# Patient Record
Sex: Female | Born: 1995 | Race: Asian | Hispanic: No
Health system: Midwestern US, Community
[De-identification: ages and names within clinical notes are randomized; demographics above are authoritative.]

## PROBLEM LIST (undated history)

## (undated) DIAGNOSIS — Z23 Encounter for immunization: Secondary | ICD-10-CM

## (undated) DIAGNOSIS — Z20820 Contact with and (suspected) exposure to varicella: Secondary | ICD-10-CM

---

## 2019-12-26 DIAGNOSIS — Z227 Latent tuberculosis: Secondary | ICD-10-CM

## 2019-12-26 HISTORY — DX: Latent tuberculosis: Z22.7

## 2020-01-08 ENCOUNTER — Encounter

## 2020-01-08 ENCOUNTER — Inpatient Hospital Stay: Admit: 2020-01-08 | Discharge: 2020-01-08 | Payer: PRIVATE HEALTH INSURANCE

## 2020-01-08 DIAGNOSIS — Z20820 Contact with and (suspected) exposure to varicella: Secondary | ICD-10-CM

## 2020-01-08 MED ORDER — IMMUNE GLOB,GAMM(IGG)10 %-MALT-IGA OVER 50 MCG/ML INTRAVENOUS SOLUTION
10 % | Freq: Once | INTRAVENOUS | Status: DC
Start: 2020-01-08 — End: 2020-01-09
  Administered 2020-01-08: 19:00:00 via INTRAVENOUS

## 2020-01-08 MED FILL — OCTAGAM 10 % INTRAVENOUS SOLUTION: 10 % | INTRAVENOUS | Qty: 250

## 2020-01-08 NOTE — Progress Notes (Signed)
Received patient to room 302 at this time for IVIG tx.  Patient accompanied by Michela Pitcher interpreter.  Patient oriented to room and plan of care.  Vitals taken- see flowsheet.  IV started- see avatar.  Waiting on MD orders.      1515:  IVIG started at 30 mls per hour.  Patient educated on s/s of reaction via [employer] interpreter.  (AMN healthcare interpretation service not available at this time).  Remaining with patient for first 10 mins of infusion.     1525:  Patient with no s/s of rxn.   Patient instructed to call nurse if any unusual symptoms arise.  Call bell in reach.       1545:  Vitals taken- see flow sheet.  No s/s of rxn.  Infusion titrated up to 60 mls/hr.    1620:  Vitals taken- see flow sheet.  No s/s of rxn.  Infusion titrated up to 120 mls/hr.      1650:  Vitals taken- see flow sheet.  No s/s of rxn.  Infusion titrated up to 240 mls/hr.    1730:  Infusion complete.  Patient tolerated treatment well.  Vitals taken- see flow sheet.  Transportation at [employer] [facility] contacted to pick up patient.      1910:  Soldier here to pick up patient.

## 2020-04-26 NOTE — L&D Delivery Note (Signed)
Operative Delivery Note After 2 hours of laboring down, the pt felt some pressure, baby at +2 station.  Called to attend delivery and at the same time, the FHR dropped to 70-80's.  Dr. Shawnie Pons arrived simultaneously.  FHR remained low, baby crowning and instructed to keep pushing.  However, the baby wasn't moving, so Kiwi recommended to pt and partner. At  a viable female was delivered via .  Presentation: vertex; Position: Right,, Occiput,, Anterior; Station: +3.  Verbal consent: obtained from family and pt.  Kiwi placed on fetal vertex, had one pop-off, and baby delivered over one contraction.  APGAR: 8/9 ; weight   pending.  After -2 minutes, the cord was clamped and cut. 40 units of pitocin diluted in 1000cc LR was infused rapidly IV.  The placenta separated spontaneously and delivered via CCT and maternal pushing effort.  It was inspected and appears to be intact with a 3 VC. There was 700cc of blood (no clots) in the amniotic sac when the placenta was delivered.  Manual sweep of LUS did not reveal any rupture.  Throughout the repair, the uterus would have periods of bogginess/bleeding.  TXA 1gm given IV and cytotec given PR>bladder emptied of 200cc urine, clots removed from LUS/cx.       Cord pH: 7.16  Anesthesia:  epidural Episiotomy:   Lacerations:  2nd degree Suture Repair: 2.0 vicryl Est. Blood Loss (mL):  1100  Mom to postpartum.  Baby to Couplet care / Skin to Skin.  Jean Myers 07/28/2020, 11:21 PM

## 2020-05-21 ENCOUNTER — Ambulatory Visit (INDEPENDENT_AMBULATORY_CARE_PROVIDER_SITE_OTHER): Payer: Medicaid Other | Admitting: Internal Medicine

## 2020-05-21 ENCOUNTER — Encounter: Payer: Self-pay | Admitting: Internal Medicine

## 2020-05-21 ENCOUNTER — Other Ambulatory Visit: Payer: Self-pay

## 2020-05-21 VITALS — BP 100/60 | HR 99 | Resp 12 | Ht 61.5 in | Wt 165.5 lb

## 2020-05-21 DIAGNOSIS — Z3490 Encounter for supervision of normal pregnancy, unspecified, unspecified trimester: Secondary | ICD-10-CM

## 2020-05-21 DIAGNOSIS — Z227 Latent tuberculosis: Secondary | ICD-10-CM | POA: Diagnosis not present

## 2020-05-21 LAB — POCT URINE PREGNANCY: Preg Test, Ur: POSITIVE — AB

## 2020-05-21 MED ORDER — PNV PRENATAL PLUS MULTIVITAMIN 27-1 MG PO TABS: ORAL_TABLET | ORAL | 11 refills | Status: DC

## 2020-05-21 NOTE — Progress Notes (Addendum)
    Subjective:    Patient ID: Jean Myers, female   DOB: 10-23-1995, 25 y.o.   MRN: [id number]   HPI  Here to establish Jean Myers interprets [place] via phone Sponsor I spoke with to get her in quickly not present.  Her husband is here and they have no documents of her previous care.  Was pregnant before fleeing [place].  She does not recall her last period.  [place] ago. Had not had period for 2 months prior to leaving [place].  She did have an ultrasound for dates at the camp in [place] after arriving in [place].  States she was told she was at 7 months gestation--December 15th.  Was to have another ultrasound on January 15th, but had already left for [facility].    No problems with appetite.  G3P2 4.25 yo delivered by NVD  25 yo delivered by C/S for fetal Macrosomia --11-12 lb baby.  Did have prenatal care.  Denies history of GDM.  Did have blood testing at 4 and 7 months gestation.    No outpatient medications have been marked as taking for the 05/21/20 encounter (Office Visit) with Jean Manson, MD.  Taking PNV or folic acid daily she received at camp.  Sounds like folic acid as very small pill. 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  No Known Allergies      Review of Systems    Objective:   BP 100/60 (BP Location: Left Arm, Patient Position: Sitting, Cuff Size: Normal)   Pulse 99   Resp 12   Ht 5' 1.5" (1.562 m)   Wt 165 lb 8 oz (75.1 kg)   BMI 30.76 kg/m   Physical Exam NAD Lungs:  CTA CV:  RRR without murmur or rub.  Radial and DP pulses normal and equal. Abd:  obviously pregnant.  Do not have ability to listen to fetal HB.  Fundal measurement of approximately 31 cm LE:  No edema.  Urine HCG:  Positive  Assessment & Plan   Pregnancy, uncertain gestational age Concerned, however, she is 7 + months and is not established yet with an OB to receive prenatal care,  particularly with her last child 3 years ago delivered by C/S.   Based on speaking with Jean Myers, she likely has Medicaid and therefore, would not qualify for Adopt A Mom. Her sponsor has no information regarding whether application process was started on this. Called into [facility] and spoke with Jean Myers at [phone].   First appt not until Feb 4th, but will see if can work her in sooner. Will speak with woman sponsor when she returns to [facility] and whether Medicaid application is in the works.  2.  HM:  [employer] [id number].  Discussion of CDC recommendation from [facility] for pregnant women and their babies to be at increased risk of bad outcome with COVID infection.  Previously received Linwood Dibbles on 12/31/19 and recommended to have booster with [employer] or [employer].  3.  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

## 2020-05-29 ENCOUNTER — Ambulatory Visit
Admission: RE | Admit: 2020-05-29 | Discharge: 2020-05-29 | Disposition: A | Discharge status: Home / Self Care | Payer: No Typology Code available for payment source | Source: Ambulatory Visit | Attending: Obstetrics and Gynecology | Admitting: Obstetrics and Gynecology

## 2020-05-29 ENCOUNTER — Other Ambulatory Visit: Payer: Self-pay

## 2020-05-29 ENCOUNTER — Other Ambulatory Visit: Payer: Self-pay | Admitting: Obstetrics and Gynecology

## 2020-05-29 DIAGNOSIS — Z111 Encounter for screening for respiratory tuberculosis: Secondary | ICD-10-CM

## 2020-06-07 ENCOUNTER — Inpatient Hospital Stay (HOSPITAL_BASED_OUTPATIENT_CLINIC_OR_DEPARTMENT_OTHER): Payer: Medicaid Other

## 2020-06-07 ENCOUNTER — Inpatient Hospital Stay (HOSPITAL_COMMUNITY)
Admission: AD | Admit: 2020-06-07 | Discharge: 2020-06-07 | Disposition: A | Discharge status: Home / Self Care | Payer: Medicaid Other | Attending: Obstetrics and Gynecology | Admitting: Obstetrics and Gynecology

## 2020-06-07 ENCOUNTER — Encounter (HOSPITAL_COMMUNITY): Payer: Self-pay | Admitting: Obstetrics and Gynecology

## 2020-06-07 ENCOUNTER — Other Ambulatory Visit: Payer: Self-pay

## 2020-06-07 DIAGNOSIS — Z3A33 33 weeks gestation of pregnancy: Secondary | ICD-10-CM | POA: Diagnosis not present

## 2020-06-07 DIAGNOSIS — O0933 Supervision of pregnancy with insufficient antenatal care, third trimester: Secondary | ICD-10-CM | POA: Diagnosis not present

## 2020-06-07 DIAGNOSIS — O26893 Other specified pregnancy related conditions, third trimester: Secondary | ICD-10-CM | POA: Diagnosis not present

## 2020-06-07 DIAGNOSIS — O99891 Other specified diseases and conditions complicating pregnancy: Secondary | ICD-10-CM

## 2020-06-07 DIAGNOSIS — O34219 Maternal care for unspecified type scar from previous cesarean delivery: Secondary | ICD-10-CM

## 2020-06-07 DIAGNOSIS — R1084 Generalized abdominal pain: Secondary | ICD-10-CM | POA: Diagnosis not present

## 2020-06-07 DIAGNOSIS — R109 Unspecified abdominal pain: Secondary | ICD-10-CM | POA: Diagnosis present

## 2020-06-07 DIAGNOSIS — Z3687 Encounter for antenatal screening for uncertain dates: Secondary | ICD-10-CM

## 2020-06-07 DIAGNOSIS — Z363 Encounter for antenatal screening for malformations: Secondary | ICD-10-CM

## 2020-06-07 LAB — CBC
HCT: 30.5 % — ABNORMAL LOW (ref 36.0–46.0)
Hemoglobin: 9.6 g/dL — ABNORMAL LOW (ref 12.0–15.0)
MCH: 25.8 pg — ABNORMAL LOW (ref 26.0–34.0)
MCHC: 31.5 g/dL (ref 30.0–36.0)
MCV: 82 fL (ref 80.0–100.0)
Platelets: 246 10*3/uL (ref 150–400)
RBC: 3.72 MIL/uL — ABNORMAL LOW (ref 3.87–5.11)
RDW: 13.2 % (ref 11.5–15.5)
WBC: 7.6 10*3/uL (ref 4.0–10.5)
nRBC: 0 % (ref 0.0–0.2)

## 2020-06-07 LAB — URINALYSIS, ROUTINE W REFLEX MICROSCOPIC
Bilirubin Urine: NEGATIVE
Glucose, UA: 100 mg/dL — AB
Hgb urine dipstick: NEGATIVE
Ketones, ur: NEGATIVE mg/dL
Nitrite: NEGATIVE
Protein, ur: NEGATIVE mg/dL
Specific Gravity, Urine: 1.02 (ref 1.005–1.030)
pH: 6.5 (ref 5.0–8.0)

## 2020-06-07 LAB — URINALYSIS, MICROSCOPIC (REFLEX)

## 2020-06-07 LAB — COMPREHENSIVE METABOLIC PANEL
ALT: 23 U/L (ref 0–44)
AST: 32 U/L (ref 15–41)
Albumin: 2.6 g/dL — ABNORMAL LOW (ref 3.5–5.0)
Alkaline Phosphatase: 102 U/L (ref 38–126)
Anion gap: 10 (ref 5–15)
BUN: 6 mg/dL (ref 6–20)
CO2: 22 mmol/L (ref 22–32)
Calcium: 8.3 mg/dL — ABNORMAL LOW (ref 8.9–10.3)
Chloride: 103 mmol/L (ref 98–111)
Creatinine, Ser: 0.44 mg/dL (ref 0.44–1.00)
GFR, Estimated: >60 mL/min (ref >60)
Glucose, Bld: 98 mg/dL (ref 70–99)
Potassium: 3.8 mmol/L (ref 3.5–5.1)
Sodium: 135 mmol/L (ref 135–145)
Total Bilirubin: 1 mg/dL (ref 0.3–1.2)
Total Protein: 6.1 g/dL — ABNORMAL LOW (ref 6.5–8.1)

## 2020-06-07 LAB — TSH: TSH: 1.471 u[IU]/mL (ref 0.350–4.500)

## 2020-06-07 LAB — ABO/RH: ABO/RH(D): A POS

## 2020-06-07 LAB — PROTEIN / CREATININE RATIO, URINE
Creatinine, Urine: 123.73 mg/dL
Protein Creatinine Ratio: 0.16 mg/mg{Cre} — ABNORMAL HIGH (ref 0.00–0.15)
Total Protein, Urine: 20 mg/dL

## 2020-06-07 LAB — RAPID HIV SCREEN (HIV 1/2 AB+AG)
HIV 1/2 Antibodies: NONREACTIVE
HIV-1 P24 Antigen - HIV24: NONREACTIVE

## 2020-06-07 LAB — HEMOGLOBIN A1C
Hgb A1c MFr Bld: 5.4 % (ref 4.8–5.6)
Mean Plasma Glucose: 108.28 mg/dL

## 2020-06-07 LAB — HEPATITIS C ANTIBODY: HCV Ab: NONREACTIVE

## 2020-06-07 MED ORDER — POLYSACCHARIDE IRON COMPLEX 150 MG PO CAPS: 150.0000 mg | ORAL_CAPSULE | ORAL | 3 refills | Status: DC

## 2020-06-07 NOTE — ED Notes (Signed)
PA at triage for MSE 

## 2020-06-07 NOTE — ED Notes (Signed)
Report called in [place] in MAU.

## 2020-06-07 NOTE — ED Triage Notes (Signed)
Emergency Medicine Provider OB Triage Evaluation Note  Jean Myers is a 25 y.o. female, G1P0, at Unknown gestation who presents to the emergency department with complaints of abdominal pain in pregnancy. Limited history as patient does not speak [place] and interpreter services are not available.  Additionally, patient cannot read, once again limiting ability to translate.  Per friend who is with patient, she has had a week of abdominal pain.  Unknown gestational age.  Per chart review, patient is thought to be about 7 months pregnant.  She did have ultrasound confirming placement in the past, however was scheduled to have repeat ultrasound and never did.  Review of  Systems  Positive: abd pain Negative: bleeding  Physical Exam  BP (!) 104/57 (BP Location: Left Arm)   Pulse 96   Temp 98.4 F (36.9 C) (Oral)   Resp 14   LMP  (LMP Unknown)   SpO2 100%  General: Awake, no distress  HEENT: Atraumatic  Resp: Normal effort  Cardiac: Normal rate Abd: Nondistended, nontender  MSK: Moves all extremities without difficulty Neuro: Speech clear  Medical Decision Making  Pt evaluated for pregnancy concern and is stable for transfer to MAU. Pt is in agreement with plan for transfer.  4:52 PM Discussed with MAU APP, Sam, who accepts patient in transfer.  Clinical Impression  No diagnosis found.     Alveria Apley, PA-C 06/07/20 1653

## 2020-06-07 NOTE — MAU Provider Note (Signed)
History     CSN: [id number]  Arrival date and time: 06/07/20 1611   Event Date/Time   First Provider Initiated Contact with Patient 06/07/20 1939      Chief Complaint  Patient presents with  . Abdominal Pain   HPI Jean Myers is a 25 y.o. G3P2002 at 7 months of pregnancy who presents to MAU from [facility] for evaluation of abdominal pain. This is a new problem, onset one month ago but intensifying in the past 1-2 weeks. Her pain score is 3/10. Her pain is located beneath her cesarean section incision. She has not taken medication or tried other treatments for this complaint.   Patient is a refugee from [place] and has not had prenatal care this pregnancy. OB history is significant for term SVD followed by cesarean for fetal macrosomia. She desires TOLAC if possible.  Patient's PMH significant for latent Tuberculosis. She is compliant with treatment and is being managed by [facility].  Patient reports being scheduled for a New OB at [facility] but states she presented for her appointment, had labs collected, and then was told she could not be seen there for prenatal care.   OB History    Gravida  3   Para  2   Term  2   Preterm  0   AB  0   Living  2     SAB  0   IAB  0   Ectopic  0   Multiple  0   Live Births  2           Past Medical History:  Diagnosis Date  . 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  obtaining treatment through [facility]--they come to her home every morning for observed treatment.    Past Surgical History:  Procedure Laterality Date  . CESAREAN SECTION      History reviewed. No pertinent family history.  Social History   Tobacco Use  . Smoking status: Never Smoker  . Smokeless tobacco: Never Used  Vaping Use  . Vaping Use: Never used  Substance Use Topics  . Alcohol use: Never  . Drug use: Never    Allergies: No Known Allergies  Medications Prior to Admission  Medication Sig Dispense Refill Last Dose  . Prenatal  Vit-Fe Fumarate-FA (PNV PRENATAL PLUS MULTIVITAMIN) 27-1 MG TABS 1 tab by mouth daily 30 tablet 11     Review of Systems  Gastrointestinal: Positive for abdominal pain.  All other systems reviewed and are negative.  Physical Exam   Blood pressure 102/61, pulse 87, temperature 98.2 F (36.8 C), temperature source Oral, resp. rate 14, SpO2 99 %.  Physical Exam Vitals and nursing note reviewed. Exam conducted with a chaperone present.  Cardiovascular:     Rate and Rhythm: Normal rate.     Heart sounds: Normal heart sounds.  Pulmonary:     Effort: Pulmonary effort is normal.     Breath sounds: Normal breath sounds.  Abdominal:     Tenderness: There is no abdominal tenderness. There is no right CVA tenderness or left CVA tenderness.  Skin:    Capillary Refill: Capillary refill takes less than 2 seconds.     Comments: LTCS scar  Neurological:     Mental Status: She is alert and oriented to person, place, and time.  Psychiatric:        Mood and Affect: Mood normal.        Behavior: Behavior normal.    MAU Course  Procedures  --Patient's sponsor reports [facility] agreed to manage treatment for latent Tb but reported she could not receive prenatal care with [facility]. --Reactive tracing: baseline 140, mod var, + 15 x 15 accels, no decels --Toco: occasional contractions, not felt by patient, in setting of closed cervix  Patient Vitals for the past 24 hrs:  BP Temp Temp src Pulse Resp SpO2  06/07/20 2102 102/61 98.2 F (36.8 C) Oral 87 14 99 %  06/07/20 1728 (!) 108/57 97.8 F (36.6 C) Oral 87 16 98 %  06/07/20 1629 (!) 104/57 98.4 F (36.9 C) Oral 96 14 100 %   Orders Placed This Encounter  Procedures  . [place] MFM OB Comp + 14 Weeks  . CBC  . Comprehensive metabolic panel  . TSH  . Hemoglobin A1c  . Rapid HIV screen (HIV 1/2 Ab+Ag)  . Hepatitis B surface antigen  . Hepatitis C antibody  . Urinalysis, Routine w reflex microscopic  . Protein / creatinine ratio, urine  . Urinalysis,  Microscopic (reflex)  . ABO/Rh  . Discharge patient   Results for orders placed or performed during the hospital encounter of 06/07/20 (from the past 24 hour(s))  CBC     Status: Abnormal   Collection Time: 06/07/20  6:28 PM  Result Value Ref Range   WBC 7.6 4.0 - 10.5 K/uL   RBC 3.72 (L) 3.87 - 5.11 MIL/uL   Hemoglobin 9.6 (L) 12.0 - 15.0 g/dL   HCT 36.1 (L) 44.3 - 15.4 %   MCV 82.0 80.0 - 100.0 fL   MCH 25.8 (L) 26.0 - 34.0 pg   MCHC 31.5 30.0 - 36.0 g/dL   RDW 00.8 67.6 - 19.5 %   Platelets 246 150 - 400 K/uL   nRBC 0.0 0.0 - 0.2 %  Comprehensive metabolic panel     Status: Abnormal   Collection Time: 06/07/20  6:28 PM  Result Value Ref Range   Sodium 135 135 - 145 mmol/L   Potassium 3.8 3.5 - 5.1 mmol/L   Chloride 103 98 - 111 mmol/L   CO2 22 22 - 32 mmol/L   Glucose, Bld 98 70 - 99 mg/dL   BUN 6 6 - 20 mg/dL   Creatinine, Ser 0.93 0.44 - 1.00 mg/dL   Calcium 8.3 (L) 8.9 - 10.3 mg/dL   Total Protein 6.1 (L) 6.5 - 8.1 g/dL   Albumin 2.6 (L) 3.5 - 5.0 g/dL   AST 32 15 - 41 U/L   ALT 23 0 - 44 U/L   Alkaline Phosphatase 102 38 - 126 U/L   Total Bilirubin 1.0 0.3 - 1.2 mg/dL   GFR, Estimated >26 >71 mL/min   Anion gap 10 5 - 15  TSH     Status: None   Collection Time: 06/07/20  6:28 PM  Result Value Ref Range   TSH 1.471 0.350 - 4.500 uIU/mL  Hemoglobin A1c     Status: None   Collection Time: 06/07/20  6:28 PM  Result Value Ref Range   Hgb A1c MFr Bld 5.4 4.8 - 5.6 %   Mean Plasma Glucose 108.28 mg/dL  Rapid HIV screen (HIV 1/2 Ab+Ag)     Status: None   Collection Time: 06/07/20  6:28 PM  Result Value Ref Range   HIV-1 P24 Antigen - HIV24 NON REACTIVE NON REACTIVE   HIV 1/2 Antibodies NON REACTIVE NON REACTIVE   Interpretation (HIV Ag Ab)      A non reactive test result means that HIV 1  or HIV 2 antibodies and HIV 1 p24 antigen were not detected in the specimen.  ABO/Rh     Status: None   Collection Time: 06/07/20  6:28 PM  Result Value Ref Range   ABO/RH(D) A  POS    No rh immune globuloin      NOT A RH IMMUNE GLOBULIN CANDIDATE, PT RH POSITIVE Performed at [facility], [address], [place], [place] [address]   Hepatitis C antibody     Status: None   Collection Time: 06/07/20  6:28 PM  Result Value Ref Range   HCV Ab NON REACTIVE NON REACTIVE  Urinalysis, Routine w reflex microscopic Urine, Clean Catch     Status: Abnormal   Collection Time: 06/07/20  6:50 PM  Result Value Ref Range   Color, Urine AMBER (A) YELLOW   APPearance CLEAR CLEAR   Specific Gravity, Urine 1.020 1.005 - 1.030   pH 6.5 5.0 - 8.0   Glucose, UA 100 (A) NEGATIVE mg/dL   Hgb urine dipstick NEGATIVE NEGATIVE   Bilirubin Urine NEGATIVE NEGATIVE   Ketones, ur NEGATIVE NEGATIVE mg/dL   Protein, ur NEGATIVE NEGATIVE mg/dL   Nitrite NEGATIVE NEGATIVE   Leukocytes,Ua TRACE (A) NEGATIVE  Protein / creatinine ratio, urine     Status: Abnormal   Collection Time: 06/07/20  6:50 PM  Result Value Ref Range   Creatinine, Urine 123.73 mg/dL   Total Protein, Urine 20 mg/dL   Protein Creatinine Ratio 0.16 (H) 0.00 - 0.15 mg/mg[Cre]  Urinalysis, Microscopic (reflex)     Status: Abnormal   Collection Time: 06/07/20  6:50 PM  Result Value Ref Range   RBC / HPF 0-5 0 - 5 RBC/hpf   WBC, UA 0-5 0 - 5 WBC/hpf   Bacteria, UA RARE (A) NONE SEEN   Squamous Epithelial / LPF 0-5 0 - 5   Mucus PRESENT    Meds ordered this encounter  Medications  . iron polysaccharides (NIFEREX) 150 MG capsule    Sig: Take 1 capsule (150 mg total) by mouth every other day.    Dispense:  30 capsule    Refill:  3    Order Specific Question:   Supervising Provider    Answer:   Warden Fillers [[id number]]   Assessment and Plan  --25 y.o. G3P2002 at [redacted]w[redacted]d  --Reactive tracing, closed cervix --No acute findings on MFM scan --Hgb 9.6, PO Fe prescribed --Discharge home in stable condition  F/U: --Message sent to [facility] to schedule appointment ASAP --Patient's sponsor given information related to  low cost medications, expectations for prenatal care timing, signs of worsening acuity which would indicate need for return to MAU  Language barrier: [place] interpreter utilized for all patient interaction  Calvert Cantor, CNM 06/07/2020, 9:17 PM

## 2020-06-07 NOTE — ED Triage Notes (Signed)
Pt fled from [place] and is pregnant.  Unsure LMP.  Sponsor that is here with her and another pregnant pt states she has been trying to get prenatal care for pt and trying to find out how far along she is.  G3. Reports abd pain x 1 week.

## 2020-06-07 NOTE — MAU Note (Signed)
Jean Myers is a 25 y.o. here in MAU reporting: pt reports she is 8 months pregnant and has not been seen by a doctor in 2 months. States she has been having pain at her c/s incision. Pain has been going on for a month but has been worse for the past 1-2 weeks. No bleeding or LOF.  Onset of complaint: ongoing  Pain score: 3/10 (mild)  Vitals:   06/07/20 1629 06/07/20 1728  BP: (!) 104/57 (!) 108/57  Pulse: 96 87  Resp: 14 16  Temp: 98.4 F (36.9 C) 97.8 F (36.6 C)  SpO2: 100% 98%     Lab orders placed from triage: entered by provider

## 2020-06-07 NOTE — Discharge Instructions (Signed)

## 2020-06-08 ENCOUNTER — Encounter: Payer: Self-pay | Admitting: Advanced Practice Midwife

## 2020-06-08 DIAGNOSIS — O34219 Maternal care for unspecified type scar from previous cesarean delivery: Secondary | ICD-10-CM | POA: Insufficient documentation

## 2020-06-08 DIAGNOSIS — Z603 Acculturation difficulty: Secondary | ICD-10-CM | POA: Insufficient documentation

## 2020-06-08 DIAGNOSIS — D509 Iron deficiency anemia, unspecified: Secondary | ICD-10-CM | POA: Insufficient documentation

## 2020-06-08 DIAGNOSIS — Z789 Other specified health status: Secondary | ICD-10-CM | POA: Insufficient documentation

## 2020-06-08 DIAGNOSIS — Z3493 Encounter for supervision of normal pregnancy, unspecified, third trimester: Secondary | ICD-10-CM | POA: Insufficient documentation

## 2020-06-08 DIAGNOSIS — Z0289 Encounter for other administrative examinations: Secondary | ICD-10-CM | POA: Insufficient documentation

## 2020-06-18 ENCOUNTER — Other Ambulatory Visit: Payer: Self-pay

## 2020-06-18 ENCOUNTER — Encounter: Payer: Self-pay | Admitting: *Deleted

## 2020-06-18 ENCOUNTER — Encounter: Payer: Self-pay | Admitting: Advanced Practice Midwife

## 2020-06-18 ENCOUNTER — Ambulatory Visit (INDEPENDENT_AMBULATORY_CARE_PROVIDER_SITE_OTHER): Payer: Medicaid Other | Admitting: Advanced Practice Midwife

## 2020-06-18 VITALS — BP 100/58 | HR 105 | Wt 178.7 lb

## 2020-06-18 DIAGNOSIS — Z98891 History of uterine scar from previous surgery: Secondary | ICD-10-CM

## 2020-06-18 DIAGNOSIS — Z3480 Encounter for supervision of other normal pregnancy, unspecified trimester: Secondary | ICD-10-CM | POA: Insufficient documentation

## 2020-06-18 DIAGNOSIS — Z23 Encounter for immunization: Secondary | ICD-10-CM | POA: Diagnosis not present

## 2020-06-18 DIAGNOSIS — Z3A35 35 weeks gestation of pregnancy: Secondary | ICD-10-CM

## 2020-06-18 NOTE — Progress Notes (Signed)
   PRENATAL VISIT NOTE  Subjective:  Jean Myers is a 25 y.o. G3P2002 at [redacted]w[redacted]d being seen today for initial prenatal visit.  She is currently monitored for the following issues for this low-risk pregnancy and has TB lung, latent; Encounter for health examination of refugee; Hx of cesarean section complicating pregnancy; Vaginal delivery; Language barrier affecting health care; Prenatal care in third trimester; Iron deficiency anemia; and Supervision of other normal pregnancy, antepartum on their problem list.  Patient reports leg pain .  Contractions: Not present. Vag. Bleeding: None.  Movement: Present. Denies leaking of fluid.   The following portions of the patient's history were reviewed and updated as appropriate: allergies, current medications, past family history, past medical history, past social history, past surgical history and problem list.   Objective:   Vitals:   06/18/20 1058  BP: (!) 100/58  Pulse: (!) 105  Weight: 178 lb 11.2 oz (81.1 kg)    Fetal Status: Fetal Heart Rate (bpm): 145   Movement: Present     General:  Alert, oriented and cooperative. Patient is in no acute distress.  Skin: Skin is warm and dry. No rash noted.   Cardiovascular: Normal heart rate noted  Respiratory: Normal respiratory effort, no problems with respiration noted  Abdomen: Soft, gravid, appropriate for gestational age.  Pain/Pressure: Present     Pelvic: Cervical exam deferred        Extremities: Normal range of motion.  Edema: Trace  Mental Status: Normal mood and affect. Normal behavior. Normal judgment and thought content.   Assessment and Plan:  Pregnancy: G3P2002 at [redacted]w[redacted]d 1. Supervision of other normal pregnancy, antepartum - HgB A1c - Hepatitis B Surface AntiGEN - Culture, OB Urine - [place] MFM OB FOLLOW UP; Future - RPR - Rubella screen - Patient had some records with her. Most of these records show testing and treatment for TB. Some prenatal information there. So based on what  she has had in MAU and what I can piece together today these labs above are needed. Plus she will need GC/CT next week with her GBS. Flu vaccine had not been given. Will get Flu vaccine today. She has had tdap and covid vaccines.   2. [redacted] weeks gestation of pregnancy - [place] MFM OB FOLLOW UP; Future - Could not find GTT in patient records, will get GTT at next visit  - GBS/GC/CT at next visit   3. History of C-section - Patient reports that she had a c-section for macrosomia. First baby was vaginal delivery at 4000g and second baby was planned c-section at 52g  - Patient would like to have TOLAC for this pregnancy  - No prior records available and unlikely we will be able to get any records.   Term labor symptoms and general obstetric precautions including but not limited to vaginal bleeding, contractions, leaking of fluid and fetal movement were reviewed in detail with the patient. Please refer to After Visit Summary for other counseling recommendations.   Return in about 1 week (around 06/25/2020).  No future appointments.  Thressa Sheller DNP, CNM  06/18/20  11:31 AM

## 2020-06-18 NOTE — Progress Notes (Signed)
Addendum: Pregnancy Medicaid home form completed. Jadah Bobak,RN

## 2020-06-18 NOTE — Progress Notes (Signed)
Patient states she has leg cramps at night. Patient has normal clear vaginal discharge.

## 2020-06-19 LAB — HEMOGLOBIN A1C
Est. average glucose Bld gHb Est-mCnc: 111 mg/dL
Hgb A1c MFr Bld: 5.5 % (ref 4.8–5.6)

## 2020-06-19 LAB — RUBELLA SCREEN: Rubella Antibodies, IGG: 1.49 index (ref >0.99)

## 2020-06-19 LAB — HEPATITIS B SURFACE ANTIGEN: Hepatitis B Surface Ag: NEGATIVE

## 2020-06-19 LAB — RPR: RPR Ser Ql: NONREACTIVE

## 2020-06-20 LAB — URINE CULTURE, OB REFLEX

## 2020-06-20 LAB — CULTURE, OB URINE

## 2020-06-24 ENCOUNTER — Other Ambulatory Visit: Payer: Self-pay | Admitting: *Deleted

## 2020-06-24 DIAGNOSIS — Z3480 Encounter for supervision of other normal pregnancy, unspecified trimester: Secondary | ICD-10-CM

## 2020-06-26 ENCOUNTER — Other Ambulatory Visit: Payer: No Typology Code available for payment source

## 2020-06-26 ENCOUNTER — Other Ambulatory Visit: Payer: Self-pay

## 2020-06-26 ENCOUNTER — Other Ambulatory Visit (HOSPITAL_COMMUNITY)
Admission: RE | Admit: 2020-06-26 | Discharge: 2020-06-26 | Disposition: A | Discharge status: Home / Self Care | Payer: Medicaid Other | Source: Ambulatory Visit | Attending: Obstetrics and Gynecology | Admitting: Obstetrics and Gynecology

## 2020-06-26 ENCOUNTER — Ambulatory Visit (INDEPENDENT_AMBULATORY_CARE_PROVIDER_SITE_OTHER): Payer: Medicaid Other | Admitting: Obstetrics and Gynecology

## 2020-06-26 ENCOUNTER — Encounter: Payer: Self-pay | Admitting: *Deleted

## 2020-06-26 VITALS — BP 117/69 | HR 101

## 2020-06-26 DIAGNOSIS — Z3480 Encounter for supervision of other normal pregnancy, unspecified trimester: Secondary | ICD-10-CM

## 2020-06-26 DIAGNOSIS — O34219 Maternal care for unspecified type scar from previous cesarean delivery: Secondary | ICD-10-CM

## 2020-06-26 DIAGNOSIS — Z789 Other specified health status: Secondary | ICD-10-CM

## 2020-06-26 DIAGNOSIS — Z3A36 36 weeks gestation of pregnancy: Secondary | ICD-10-CM

## 2020-06-26 NOTE — Progress Notes (Signed)
Jean Myers in person interpreter .

## 2020-06-27 LAB — CBC
Hematocrit: 29.5 % — ABNORMAL LOW (ref 34.0–46.6)
Hemoglobin: 9.3 g/dL — ABNORMAL LOW (ref 11.1–15.9)
MCH: 24 pg — ABNORMAL LOW (ref 26.6–33.0)
MCHC: 31.5 g/dL (ref 31.5–35.7)
MCV: 76 fL — ABNORMAL LOW (ref 79–97)
Platelets: 232 10*3/uL (ref 150–450)
RBC: 3.87 x10E6/uL (ref 3.77–5.28)
RDW: 14.2 % (ref 11.7–15.4)
WBC: 7.4 10*3/uL (ref 3.4–10.8)

## 2020-06-27 LAB — CERVICOVAGINAL ANCILLARY ONLY
Chlamydia: NEGATIVE
Comment: NEGATIVE
Comment: NORMAL
Neisseria Gonorrhea: NEGATIVE

## 2020-06-27 LAB — RPR: RPR Ser Ql: NONREACTIVE

## 2020-06-27 LAB — GLUCOSE TOLERANCE, 2 HOURS W/ 1HR
Glucose, 1 hour: 113 mg/dL (ref 65–179)
Glucose, 2 hour: 111 mg/dL (ref 65–152)
Glucose, Fasting: 73 mg/dL (ref 65–91)

## 2020-06-27 LAB — HIV ANTIBODY (ROUTINE TESTING W REFLEX): HIV Screen 4th Generation wRfx: NONREACTIVE

## 2020-06-28 LAB — STREP GP B NAA: Strep Gp B NAA: NEGATIVE

## 2020-06-28 NOTE — Progress Notes (Signed)
Prenatal Visit Note Date: 06/26/2020 Clinic: [facility]  Subjective:  Jean Myers is a 25 y.o. G3P2002 at [redacted]w[redacted]d being seen today for ongoing prenatal care.  She is currently monitored for the following issues for this high-risk pregnancy and has TB lung, latent; Encounter for health examination of refugee; Hx of cesarean section complicating pregnancy; Language barrier affecting health care; Prenatal care in third trimester; Iron deficiency anemia; and Supervision of other normal pregnancy, antepartum on their problem list.  Patient reports no complaints.   Contractions: Not present. Vag. Bleeding: None.  Movement: Present. Denies leaking of fluid.   The following portions of the patient's history were reviewed and updated as appropriate: allergies, current medications, past family history, past medical history, past social history, past surgical history and problem list. Problem list updated.  Objective:   Vitals:   06/26/20 1013  BP: 117/69  Pulse: (!) 101    Fetal Status: Fetal Heart Rate (bpm): 155 Fundal Height: 36 cm Movement: Present     General:  Alert, oriented and cooperative. Patient is in no acute distress.  Skin: Skin is warm and dry. No rash noted.   Cardiovascular: Normal heart rate noted  Respiratory: Normal respiratory effort, no problems with respiration noted  Abdomen: Soft, gravid, appropriate for gestational age. Pain/Pressure: Present     Pelvic:  Cervical exam deferred        Extremities: Normal range of motion.  Edema: Trace  Mental Status: Normal mood and affect. Normal behavior. Normal judgment and thought content.   Urinalysis:      Assessment and Plan:  Pregnancy: G3P2002 at [redacted]w[redacted]d  1. Hx of cesarean section complicating pregnancy G1: VD. G2: scheduled primary c/s for "big baby". Pt states she was never told she had to have another c/s; that child was 8.5 lbs. D/w her re: tolac r/b/a and she would like to tolac. Consent signed  today - Strep Gp B NAA - Cervicovaginal ancillary only( [place])  2. Supervision of other normal pregnancy, antepartum Routine care. GBS done today. 2h GTT today. Has f/u u/s for anatomy on 3/11  3. Language barrier affecting health care In person interpreter used  Preterm labor symptoms and general obstetric precautions including but not limited to vaginal bleeding, contractions, leaking of fluid and fetal movement were reviewed in detail with the patient. Please refer to After Visit Summary for other counseling recommendations.  RTC 7-10d    [place] Bing, MD

## 2020-06-30 ENCOUNTER — Encounter: Payer: Self-pay | Admitting: Family Medicine

## 2020-07-02 ENCOUNTER — Encounter: Payer: Self-pay | Admitting: Obstetrics and Gynecology

## 2020-07-02 DIAGNOSIS — O99019 Anemia complicating pregnancy, unspecified trimester: Secondary | ICD-10-CM | POA: Insufficient documentation

## 2020-07-03 ENCOUNTER — Encounter: Payer: Self-pay | Admitting: Obstetrics & Gynecology

## 2020-07-03 ENCOUNTER — Ambulatory Visit (INDEPENDENT_AMBULATORY_CARE_PROVIDER_SITE_OTHER): Payer: Medicaid Other | Admitting: Obstetrics & Gynecology

## 2020-07-03 ENCOUNTER — Other Ambulatory Visit: Payer: Self-pay

## 2020-07-03 VITALS — BP 101/66 | HR 103 | Wt 182.7 lb

## 2020-07-03 DIAGNOSIS — Z227 Latent tuberculosis: Secondary | ICD-10-CM

## 2020-07-03 DIAGNOSIS — Z789 Other specified health status: Secondary | ICD-10-CM

## 2020-07-03 DIAGNOSIS — O34219 Maternal care for unspecified type scar from previous cesarean delivery: Secondary | ICD-10-CM

## 2020-07-03 DIAGNOSIS — Z3480 Encounter for supervision of other normal pregnancy, unspecified trimester: Secondary | ICD-10-CM

## 2020-07-03 NOTE — Patient Instructions (Signed)
Vaginal Birth After Cesarean Delivery  Vaginal birth after cesarean delivery (VBAC) is giving birth vaginally after previously delivering a baby through a cesarean section (C-section). A VBAC may be a safe option for you, depending on your health and other factors. It is important to discuss VBAC with your health care provider early in your pregnancy so you can understand the risks, benefits, and options. Having these discussions early will give you time to make your birth plan. Who are the best candidates for VBAC? The best candidates for VBAC are women who:  Have had one or two prior cesarean deliveries, and the incision made during the delivery was horizontal (low transverse).  Do not have a vertical (classical) scar on their uterus.  Have not had a tear in the wall of their uterus (uterine rupture).  Plan to have more pregnancies. A VBAC is also more likely to be successful:  In women who have previously given birth vaginally.  When labor starts by itself (spontaneously) before the due date. What are the benefits of VBAC? The benefits of delivering your baby vaginally instead of by a cesarean delivery include:  A shorter hospital stay.  A faster recovery time.  Less pain.  Avoiding risks associated with major surgery, such as infection and blood clots.  Less blood loss and less need for donated blood (transfusions). What are the risks of VBAC? The main risk of attempting a VBAC is that it may fail, forcing your health care provider to deliver your baby by a C-section. Other risks are rare and include:  Tearing (rupture) of the scar from a past cesarean delivery.  Other risks associated with vaginal deliveries. If a repeat cesarean delivery is needed, the risks include:  Blood loss.  Infection.  Blood clot.  Damage to surrounding organs.  Removal of the uterus (hysterectomy), if it is damaged.  Placenta problems in future pregnancies. What else should I know  about my options? Delivering a baby through a VBAC is similar to having a normal spontaneous vaginal delivery. Therefore, it is safe:  To try with twins.  For your health care provider to try to turn the baby from a breech position (external cephalic version) during labor.  With epidural analgesia for pain relief. Consider where you would like to deliver your baby. VBAC should be attempted in facilities where an emergency cesarean delivery can be performed. VBAC is not recommended for home births. Any changes in your health or your baby's health during your pregnancy may make it necessary to change your initial decision about VBAC. Your health care provider may recommend that you do not attempt a VBAC if:  Your baby's suspected weight is 8.8 lb (4 kg) or more.  You have preeclampsia. This is a condition that causes high blood pressure along with other symptoms, such as swelling and headaches.  You will have VBAC less than 19 months after your cesarean delivery.  You are past your due date.  You need to have labor started (induced) because your cervix is not ready for labor (unfavorable). Where to find more information  American Pregnancy Association: americanpregnancy.org  American Congress of Obstetricians and Gynecologists: acog.org Summary  Vaginal birth after cesarean delivery (VBAC) is giving birth vaginally after previously delivering a baby through a cesarean section (C-section). A VBAC may be a safe option for you, depending on your health and other factors.  Discuss VBAC with your health care provider early in your pregnancy so you can understand the risks, benefits, options, and   have plenty of time to make your birth plan.  The main risk of attempting a VBAC is that it may fail, forcing your health care provider to deliver your baby by a C-section. Other risks are rare. This information is not intended to replace advice given to you by your health care provider. Make sure  you discuss any questions you have with your health care provider. Document Revised: 08/08/2018 Document Reviewed: 07/20/2016 Elsevier Patient Education  2021 Elsevier Inc.  

## 2020-07-03 NOTE — Progress Notes (Signed)
   PRENATAL VISIT NOTE  Subjective:  Jean Myers is a 25 y.o. G3P2002 at [redacted]w[redacted]d being seen today for ongoing prenatal care.  She is currently monitored for the following issues for this high-risk pregnancy and has TB lung, latent; Encounter for health examination of refugee; Hx of cesarean section complicating pregnancy; Language barrier affecting health care; Prenatal care in third trimester; Iron deficiency anemia; Supervision of other normal pregnancy, antepartum; and Anemia in pregnancy on their problem list.  Patient reports no complaints.  Contractions: Not present. Vag. Bleeding: None.  Movement: Present. Denies leaking of fluid.   The following portions of the patient's history were reviewed and updated as appropriate: allergies, current medications, past family history, past medical history, past social history, past surgical history and problem list.   Objective:   Vitals:   07/03/20 1045  BP: 101/66  Pulse: (!) 103  Weight: 182 lb 11.2 oz (82.9 kg)    Fetal Status: Fetal Heart Rate (bpm): 147   Movement: Present     General:  Alert, oriented and cooperative. Patient is in no acute distress.  Skin: Skin is warm and dry. No rash noted.   Cardiovascular: Normal heart rate noted  Respiratory: Normal respiratory effort, no problems with respiration noted  Abdomen: Soft, gravid, appropriate for gestational age.  Pain/Pressure: Present     Pelvic: Cervical exam deferred        Extremities: Normal range of motion.     Mental Status: Normal mood and affect. Normal behavior. Normal judgment and thought content.   Assessment and Plan:  Pregnancy: G3P2002 at [redacted]w[redacted]d 1. Supervision of other normal pregnancy, antepartum Tooth pain probable molar carie - Ambulatory referral to Dentistry  2. TB lung, latent Treated through [facility]  3. Hx of cesarean section complicating pregnancy Plans TOLAC  4. Language barrier affecting health care Dari interpreter  Term labor symptoms and  general obstetric precautions including but not limited to vaginal bleeding, contractions, leaking of fluid and fetal movement were reviewed in detail with the patient. Please refer to After Visit Summary for other counseling recommendations.   Return in about 1 week (around 07/10/2020).  Future Appointments  Date Time Provider Department Center  07/04/2020  1:30 PM [facility]    Scheryl Darter, MD

## 2020-07-03 NOTE — Progress Notes (Signed)
Patient complains of pressure in lower abdomen, where c-section has been done.

## 2020-07-04 ENCOUNTER — Ambulatory Visit: Payer: Medicaid Other | Attending: Advanced Practice Midwife

## 2020-07-04 DIAGNOSIS — Z98891 History of uterine scar from previous surgery: Secondary | ICD-10-CM | POA: Diagnosis present

## 2020-07-04 DIAGNOSIS — O09293 Supervision of pregnancy with other poor reproductive or obstetric history, third trimester: Secondary | ICD-10-CM | POA: Diagnosis not present

## 2020-07-04 DIAGNOSIS — O34219 Maternal care for unspecified type scar from previous cesarean delivery: Secondary | ICD-10-CM | POA: Diagnosis not present

## 2020-07-04 DIAGNOSIS — Z362 Encounter for other antenatal screening follow-up: Secondary | ICD-10-CM

## 2020-07-04 DIAGNOSIS — O0933 Supervision of pregnancy with insufficient antenatal care, third trimester: Secondary | ICD-10-CM | POA: Diagnosis not present

## 2020-07-04 DIAGNOSIS — Z3A37 37 weeks gestation of pregnancy: Secondary | ICD-10-CM

## 2020-07-04 DIAGNOSIS — Z3480 Encounter for supervision of other normal pregnancy, unspecified trimester: Secondary | ICD-10-CM | POA: Insufficient documentation

## 2020-07-04 DIAGNOSIS — Z3A35 35 weeks gestation of pregnancy: Secondary | ICD-10-CM | POA: Diagnosis present

## 2020-07-04 DIAGNOSIS — O26843 Uterine size-date discrepancy, third trimester: Secondary | ICD-10-CM

## 2020-07-08 ENCOUNTER — Telehealth: Payer: Self-pay

## 2020-07-08 NOTE — Telephone Encounter (Signed)
Tried to get [employer] from [employer], none available. Pt has an appointment on 07/10/2020, will set up Iron IV appointment with her on that date.

## 2020-07-08 NOTE — Telephone Encounter (Signed)
-----   Message from Igiugig Bing, MD sent at 07/02/2020  1:23 PM EST ----- Can you call her and let her know that we recommend she get IV iron? I put the orders in the computer. thanks

## 2020-07-10 ENCOUNTER — Ambulatory Visit (INDEPENDENT_AMBULATORY_CARE_PROVIDER_SITE_OTHER): Payer: Medicaid Other | Admitting: Obstetrics & Gynecology

## 2020-07-10 ENCOUNTER — Other Ambulatory Visit: Payer: Self-pay

## 2020-07-10 VITALS — BP 99/62 | HR 91 | Wt 181.0 lb

## 2020-07-10 DIAGNOSIS — Z3480 Encounter for supervision of other normal pregnancy, unspecified trimester: Secondary | ICD-10-CM

## 2020-07-10 DIAGNOSIS — Z603 Acculturation difficulty: Secondary | ICD-10-CM

## 2020-07-10 DIAGNOSIS — Z789 Other specified health status: Secondary | ICD-10-CM

## 2020-07-10 DIAGNOSIS — Z3493 Encounter for supervision of normal pregnancy, unspecified, third trimester: Secondary | ICD-10-CM

## 2020-07-10 DIAGNOSIS — O34219 Maternal care for unspecified type scar from previous cesarean delivery: Secondary | ICD-10-CM

## 2020-07-10 DIAGNOSIS — O24410 Gestational diabetes mellitus in pregnancy, diet controlled: Secondary | ICD-10-CM

## 2020-07-10 NOTE — Progress Notes (Signed)
[employer] # [phone]

## 2020-07-10 NOTE — Patient Instructions (Addendum)
Vaginal Birth After Cesarean Delivery  Vaginal birth after cesarean delivery (VBAC) is giving birth vaginally after previously delivering a baby through a cesarean section (C-section). A VBAC may be a safe option for you, depending on your health and other factors. It is important to discuss VBAC with your health care provider early in your pregnancy so you can understand the risks, benefits, and options. Having these discussions early will give you time to make your birth plan. Who are the best candidates for VBAC? The best candidates for VBAC are women who:  Have had one or two prior cesarean deliveries, and the incision made during the delivery was horizontal (low transverse).  Do not have a vertical (classical) scar on their uterus.  Have not had a tear in the wall of their uterus (uterine rupture).  Plan to have more pregnancies. A VBAC is also more likely to be successful:  In women who have previously given birth vaginally.  When labor starts by itself (spontaneously) before the due date. What are the benefits of VBAC? The benefits of delivering your baby vaginally instead of by a cesarean delivery include:  A shorter hospital stay.  A faster recovery time.  Less pain.  Avoiding risks associated with major surgery, such as infection and blood clots.  Less blood loss and less need for donated blood (transfusions). What are the risks of VBAC? The main risk of attempting a VBAC is that it may fail, forcing your health care provider to deliver your baby by a C-section. Other risks are rare and include:  Tearing (rupture) of the scar from a past cesarean delivery.  Other risks associated with vaginal deliveries. If a repeat cesarean delivery is needed, the risks include:  Blood loss.  Infection.  Blood clot.  Damage to surrounding organs.  Removal of the uterus (hysterectomy), if it is damaged.  Placenta problems in future pregnancies. What else should I know  about my options? Delivering a baby through a VBAC is similar to having a normal spontaneous vaginal delivery. Therefore, it is safe:  To try with twins.  For your health care provider to try to turn the baby from a breech position (external cephalic version) during labor.  With epidural analgesia for pain relief. Consider where you would like to deliver your baby. VBAC should be attempted in facilities where an emergency cesarean delivery can be performed. VBAC is not recommended for home births. Any changes in your health or your baby's health during your pregnancy may make it necessary to change your initial decision about VBAC. Your health care provider may recommend that you do not attempt a VBAC if:  Your baby's suspected weight is 8.8 lb (4 kg) or more.  You have preeclampsia. This is a condition that causes high blood pressure along with other symptoms, such as swelling and headaches.  You will have VBAC less than 19 months after your cesarean delivery.  You are past your due date.  You need to have labor started (induced) because your cervix is not ready for labor (unfavorable). Where to find more information  American Pregnancy Association: americanpregnancy.org  American Congress of Obstetricians and Gynecologists: acog.org Summary  Vaginal birth after cesarean delivery (VBAC) is giving birth vaginally after previously delivering a baby through a cesarean section (C-section). A VBAC may be a safe option for you, depending on your health and other factors.  Discuss VBAC with your health care provider early in your pregnancy so you can understand the risks, benefits, options, and   have plenty of time to make your birth plan.  The main risk of attempting a VBAC is that it may fail, forcing your health care provider to deliver your baby by a C-section. Other risks are rare. This information is not intended to replace advice given to you by your health care provider. Make sure  you discuss any questions you have with your health care provider. Document Revised: 08/08/2018 Document Reviewed: 07/20/2016 Elsevier Patient Education  2021 Elsevier Inc.  

## 2020-07-10 NOTE — Progress Notes (Signed)
   PRENATAL VISIT NOTE  Subjective:  Jean Myers is a 25 y.o. G3P2002 at [redacted]w[redacted]d being seen today for ongoing prenatal care.  She is currently monitored for the following issues for this high-risk pregnancy and has TB lung, latent; Encounter for health examination of refugee; Hx of cesarean section complicating pregnancy; Language barrier affecting health care; Prenatal care in third trimester; Iron deficiency anemia; Supervision of other normal pregnancy, antepartum; and Anemia in pregnancy on their problem list.  Patient reports no complaints.  Contractions: Not present. Vag. Bleeding: None.  Movement: Present. Denies leaking of fluid.   The following portions of the patient's history were reviewed and updated as appropriate: allergies, current medications, past family history, past medical history, past social history, past surgical history and problem list.   Objective:   Vitals:   07/10/20 1520  BP: 99/62  Pulse: 91  Weight: 181 lb (82.1 kg)    Fetal Status: Fetal Heart Rate (bpm): 157   Movement: Present     General:  Alert, oriented and cooperative. Patient is in no acute distress.  Skin: Skin is warm and dry. No rash noted.   Cardiovascular: Normal heart rate noted  Respiratory: Normal respiratory effort, no problems with respiration noted  Abdomen: Soft, gravid, appropriate for gestational age.  Pain/Pressure: Present     Pelvic: Cervical exam deferred        Extremities: Normal range of motion.  Edema: Trace  Mental Status: Normal mood and affect. Normal behavior. Normal judgment and thought content.   Assessment and Plan:  Pregnancy: G3P2002 at [redacted]w[redacted]d 1. Supervision of other normal pregnancy, antepartum Doing well  2. Prenatal care in third trimester   3. Language barrier affecting health care Dari interpreter  4. Hx of cesarean section complicating pregnancy Plans TOLAC  Term labor symptoms and general obstetric precautions including but not limited to vaginal  bleeding, contractions, leaking of fluid and fetal movement were reviewed in detail with the patient. Please refer to After Visit Summary for other counseling recommendations.   Return in about 1 week (around 07/17/2020).  No future appointments.  Scheryl Darter, MD

## 2020-07-16 ENCOUNTER — Telehealth: Payer: Self-pay

## 2020-07-16 ENCOUNTER — Other Ambulatory Visit: Payer: Self-pay

## 2020-07-16 ENCOUNTER — Ambulatory Visit (INDEPENDENT_AMBULATORY_CARE_PROVIDER_SITE_OTHER): Payer: Self-pay | Admitting: Obstetrics & Gynecology

## 2020-07-16 VITALS — BP 112/63 | HR 100 | Wt 181.0 lb

## 2020-07-16 DIAGNOSIS — Z3A39 39 weeks gestation of pregnancy: Secondary | ICD-10-CM

## 2020-07-16 DIAGNOSIS — Z3480 Encounter for supervision of other normal pregnancy, unspecified trimester: Secondary | ICD-10-CM

## 2020-07-16 DIAGNOSIS — O34219 Maternal care for unspecified type scar from previous cesarean delivery: Secondary | ICD-10-CM

## 2020-07-16 MED ORDER — DIPHENHYDRAMINE HCL 25 MG PO TABS: 25.0000 mg | ORAL_TABLET | Freq: Four times a day (QID) | ORAL | 2 refills | Status: DC | PRN

## 2020-07-16 MED ORDER — DOCUSATE SODIUM 100 MG PO CAPS: 100.0000 mg | ORAL_CAPSULE | Freq: Two times a day (BID) | ORAL | 2 refills | Status: DC | PRN

## 2020-07-16 NOTE — Progress Notes (Signed)
   PRENATAL VISIT NOTE  Subjective:  Jean Myers is a 25 y.o. G3P2002 at [redacted]w[redacted]d being seen today for ongoing prenatal care. Patient is Dari-speaking only, interpreter present for this encounter.  She is currently monitored for the following issues for this low-risk pregnancy and has TB lung, latent; Encounter for health examination of refugee; Hx of cesarean section complicating pregnancy; Language barrier affecting health care; Prenatal care in third trimester; Iron deficiency anemia; Supervision of other normal pregnancy, antepartum; and Anemia in pregnancy on their problem list.  Patient reports no complaints.  Contractions: Irritability. Vag. Bleeding: None.  Movement: Present. Denies leaking of fluid.   The following portions of the patient's history were reviewed and updated as appropriate: allergies, current medications, past family history, past medical history, past social history, past surgical history and problem list.   Objective:   Vitals:   07/16/20 1351  BP: 112/63  Pulse: 100  Weight: 181 lb (82.1 kg)    Fetal Status: Fetal Heart Rate (bpm): 140 Fundal Height: 40 cm Movement: Present     General:  Alert, oriented and cooperative. Patient is in no acute distress.  Skin: Skin is warm and dry. No rash noted.   Cardiovascular: Normal heart rate noted  Respiratory: Normal respiratory effort, no problems with respiration noted  Abdomen: Soft, gravid, appropriate for gestational age.  Pain/Pressure: Present     Pelvic: Cervical exam deferred        Extremities: Normal range of motion.  Edema: Trace  Mental Status: Normal mood and affect. Normal behavior. Normal judgment and thought content.   Assessment and Plan:  Pregnancy: G3P2002 at [redacted]w[redacted]d 1. Hx of cesarean section complicating pregnancy 2. [redacted] weeks gestation of pregnancy 3. Supervision of other normal pregnancy, antepartum Desires TOLAC. Postdates testing next week IOL to be scheduled at 41 weeks, orders signed and  held. Colace given for constipation, Benadryl for [place] rash [place]. Please refer to After Visit Summary for other counseling recommendations.   Return in about 1 week (around 07/23/2020) for NST, AFI, OFFICE OB VISIT (MD only).  No future appointments.  Jaynie Collins, MD

## 2020-07-16 NOTE — Telephone Encounter (Signed)
Call received from [facility] Department to report that pt is currently having daily observed treatment for TB. Medication list updated. Report given to Ayanwu, MD. Pt will need treatment daily while inpatient for delivery. Please call Estill Bamberg at [phone] with any concerns.

## 2020-07-22 ENCOUNTER — Encounter: Payer: Self-pay | Admitting: General Practice

## 2020-07-24 ENCOUNTER — Ambulatory Visit: Payer: Self-pay

## 2020-07-24 ENCOUNTER — Other Ambulatory Visit: Payer: Self-pay

## 2020-07-24 ENCOUNTER — Ambulatory Visit (INDEPENDENT_AMBULATORY_CARE_PROVIDER_SITE_OTHER): Payer: Medicaid Other | Admitting: Obstetrics and Gynecology

## 2020-07-24 ENCOUNTER — Ambulatory Visit (INDEPENDENT_AMBULATORY_CARE_PROVIDER_SITE_OTHER): Payer: Medicaid Other | Admitting: *Deleted

## 2020-07-24 VITALS — BP 106/56 | HR 111 | Wt 181.8 lb

## 2020-07-24 DIAGNOSIS — O34219 Maternal care for unspecified type scar from previous cesarean delivery: Secondary | ICD-10-CM

## 2020-07-24 DIAGNOSIS — Z789 Other specified health status: Secondary | ICD-10-CM

## 2020-07-24 DIAGNOSIS — Z3480 Encounter for supervision of other normal pregnancy, unspecified trimester: Secondary | ICD-10-CM

## 2020-07-24 DIAGNOSIS — O480 Post-term pregnancy: Secondary | ICD-10-CM | POA: Diagnosis not present

## 2020-07-24 DIAGNOSIS — Z3A40 40 weeks gestation of pregnancy: Secondary | ICD-10-CM

## 2020-07-24 DIAGNOSIS — Z227 Latent tuberculosis: Secondary | ICD-10-CM | POA: Diagnosis not present

## 2020-07-24 NOTE — Progress Notes (Signed)
   PRENATAL VISIT NOTE  Subjective:  Jean Myers is a 25 y.o. G3P2002 at [redacted]w[redacted]d being seen today for ongoing prenatal care.  She is currently monitored for the following issues for this high-risk pregnancy and has TB lung, latent; Encounter for health examination of refugee; Hx of cesarean section complicating pregnancy; Language barrier affecting health care; Prenatal care in third trimester; Iron deficiency anemia; Supervision of other normal pregnancy, antepartum; and Anemia in pregnancy on their problem list.  Patient reports no complaints.  Contractions: Irregular. Vag. Bleeding: None.  Movement: Present. Denies leaking of fluid.   The following portions of the patient's history were reviewed and updated as appropriate: allergies, current medications, past family history, past medical history, past social history, past surgical history and problem list.   Objective:   Vitals:   07/24/20 1438  BP: (!) 106/56  Pulse: (!) 111  Weight: 181 lb 12.8 oz (82.5 kg)    Fetal Status: Fetal Heart Rate (bpm): NST   Movement: Present     General:  Alert, oriented and cooperative. Patient is in no acute distress.  Skin: Skin is warm and dry. No rash noted.   Cardiovascular: Normal heart rate noted  Respiratory: Normal respiratory effort, no problems with respiration noted  Abdomen: Soft, gravid, appropriate for gestational age.  Pain/Pressure: Present     Pelvic: Cervical exam deferred        Extremities: Normal range of motion.  Edema: Trace  Mental Status: Normal mood and affect. Normal behavior. Normal judgment and thought content.   Assessment and Plan:  Pregnancy: G3P2002 at [redacted]w[redacted]d 1. Supervision of other normal pregnancy, antepartum Routine care  2. Language barrier affecting health care Interpreter used  3. TB lung, latent Continue daily treatment when intpatient  4. Hx of cesarean section complicating pregnancy Desires tolac. Pt not set up for this. She desires to go as long  as possible and is okay with IOL; she is amenable to delivery around 41wks. Will call L&D for 4/-6 IOL.   5. [redacted] weeks gestation of pregnancy bpp 10/10 today, cephalic  Term labor symptoms and general obstetric precautions including but not limited to vaginal bleeding, contractions, leaking of fluid and fetal movement were reviewed in detail with the patient. Please refer to After Visit Summary for other counseling recommendations.   No follow-ups on file.  Future Appointments  Date Time Provider Department Center  07/24/2020  3:35 PM [facility] US1 [facility]    [place] Bing, MD

## 2020-07-24 NOTE — Progress Notes (Signed)
Interpreter present for encounter.  

## 2020-07-25 ENCOUNTER — Encounter: Payer: Self-pay | Admitting: *Deleted

## 2020-07-25 ENCOUNTER — Telehealth (HOSPITAL_COMMUNITY): Payer: Self-pay | Admitting: *Deleted

## 2020-07-25 ENCOUNTER — Encounter (HOSPITAL_COMMUNITY): Payer: Self-pay | Admitting: *Deleted

## 2020-07-25 NOTE — Telephone Encounter (Signed)
[phone] interpreter number  Preadmission screen Per Infection Prevention the patient does not need to be on isolation due to previous treatment but continue treatment

## 2020-07-26 ENCOUNTER — Other Ambulatory Visit (HOSPITAL_COMMUNITY): Payer: Medicaid Other | Attending: Family Medicine

## 2020-07-28 ENCOUNTER — Inpatient Hospital Stay (HOSPITAL_COMMUNITY): Payer: Medicaid Other

## 2020-07-28 ENCOUNTER — Other Ambulatory Visit: Payer: Self-pay

## 2020-07-28 ENCOUNTER — Inpatient Hospital Stay (HOSPITAL_COMMUNITY): Payer: Medicaid Other | Admitting: Anesthesiology

## 2020-07-28 ENCOUNTER — Encounter (HOSPITAL_COMMUNITY): Payer: Self-pay | Admitting: Family Medicine

## 2020-07-28 ENCOUNTER — Inpatient Hospital Stay (HOSPITAL_COMMUNITY)
Admission: AD | Admit: 2020-07-28 | Discharge: 2020-07-30 | DRG: 806 | Disposition: A | Discharge status: Home / Self Care | Payer: Medicaid Other | Attending: Family Medicine | Admitting: Family Medicine

## 2020-07-28 DIAGNOSIS — Z789 Other specified health status: Secondary | ICD-10-CM | POA: Diagnosis present

## 2020-07-28 DIAGNOSIS — Z20822 Contact with and (suspected) exposure to covid-19: Secondary | ICD-10-CM | POA: Diagnosis present

## 2020-07-28 DIAGNOSIS — O480 Post-term pregnancy: Principal | ICD-10-CM | POA: Diagnosis present

## 2020-07-28 DIAGNOSIS — Z3A40 40 weeks gestation of pregnancy: Secondary | ICD-10-CM

## 2020-07-28 DIAGNOSIS — O34211 Maternal care for low transverse scar from previous cesarean delivery: Secondary | ICD-10-CM | POA: Diagnosis not present

## 2020-07-28 DIAGNOSIS — O34219 Maternal care for unspecified type scar from previous cesarean delivery: Secondary | ICD-10-CM | POA: Diagnosis present

## 2020-07-28 DIAGNOSIS — D62 Acute posthemorrhagic anemia: Secondary | ICD-10-CM | POA: Diagnosis not present

## 2020-07-28 DIAGNOSIS — D509 Iron deficiency anemia, unspecified: Secondary | ICD-10-CM | POA: Diagnosis present

## 2020-07-28 DIAGNOSIS — Z8615 Personal history of latent tuberculosis infection: Secondary | ICD-10-CM | POA: Diagnosis not present

## 2020-07-28 DIAGNOSIS — Z3480 Encounter for supervision of other normal pregnancy, unspecified trimester: Secondary | ICD-10-CM

## 2020-07-28 DIAGNOSIS — O99019 Anemia complicating pregnancy, unspecified trimester: Secondary | ICD-10-CM | POA: Diagnosis present

## 2020-07-28 DIAGNOSIS — O9081 Anemia of the puerperium: Secondary | ICD-10-CM | POA: Diagnosis not present

## 2020-07-28 DIAGNOSIS — Z227 Latent tuberculosis: Secondary | ICD-10-CM | POA: Diagnosis present

## 2020-07-28 DIAGNOSIS — O0930 Supervision of pregnancy with insufficient antenatal care, unspecified trimester: Secondary | ICD-10-CM

## 2020-07-28 LAB — CBC
HCT: 30.8 % — ABNORMAL LOW (ref 36.0–46.0)
Hemoglobin: 9.2 g/dL — ABNORMAL LOW (ref 12.0–15.0)
MCH: 22.6 pg — ABNORMAL LOW (ref 26.0–34.0)
MCHC: 29.9 g/dL — ABNORMAL LOW (ref 30.0–36.0)
MCV: 75.7 fL — ABNORMAL LOW (ref 80.0–100.0)
Platelets: 229 10*3/uL (ref 150–400)
RBC: 4.07 MIL/uL (ref 3.87–5.11)
RDW: 16.7 % — ABNORMAL HIGH (ref 11.5–15.5)
WBC: 7.4 10*3/uL (ref 4.0–10.5)
nRBC: 0 % (ref 0.0–0.2)

## 2020-07-28 LAB — SARS CORONAVIRUS 2 (TAT 6-24 HRS): SARS Coronavirus 2: NEGATIVE

## 2020-07-28 LAB — TYPE AND SCREEN
ABO/RH(D): A POS
Antibody Screen: NEGATIVE

## 2020-07-28 MED ORDER — TRANEXAMIC ACID-NACL 1000-0.7 MG/100ML-% IV SOLN
INTRAVENOUS | Status: AC
  Administered 2020-07-28: 1000 mg
  Filled 2020-07-28: qty 100

## 2020-07-28 MED ORDER — BUPIVACAINE HCL (PF) 0.25 % IJ SOLN
INTRAMUSCULAR | Status: DC | PRN
  Administered 2020-07-28: 5 mL via EPIDURAL
  Administered 2020-07-28: 8 mL via EPIDURAL

## 2020-07-28 MED ORDER — MISOPROSTOL 200 MCG PO TABS
800.0000 ug | ORAL_TABLET | Freq: Once | ORAL | Status: AC
  Administered 2020-07-28: 800 ug via RECTAL

## 2020-07-28 MED ORDER — OXYTOCIN-SODIUM CHLORIDE 30-0.9 UT/500ML-% IV SOLN: 2.5000 [IU]/h | INTRAVENOUS | Status: DC

## 2020-07-28 MED ORDER — RIFAMPIN 300 MG PO CAPS
600.0000 mg | ORAL_CAPSULE | Freq: Every day | ORAL | Status: DC
  Administered 2020-07-28: 600 mg via ORAL
  Filled 2020-07-28: qty 2

## 2020-07-28 MED ORDER — SOD CITRATE-CITRIC ACID 500-334 MG/5ML PO SOLN: 30.0000 mL | ORAL | Status: DC | PRN

## 2020-07-28 MED ORDER — PHENYLEPHRINE 40 MCG/ML (10ML) SYRINGE FOR IV PUSH (FOR BLOOD PRESSURE SUPPORT)
80.0000 ug | PREFILLED_SYRINGE | INTRAVENOUS | Status: DC | PRN
  Administered 2020-07-28 (×2): 80 ug via INTRAVENOUS

## 2020-07-28 MED ORDER — TRANEXAMIC ACID-NACL 1000-0.7 MG/100ML-% IV SOLN: 1000.0000 mg | Freq: Once | INTRAVENOUS | Status: DC

## 2020-07-28 MED ORDER — OXYTOCIN-SODIUM CHLORIDE 30-0.9 UT/500ML-% IV SOLN
1.0000 m[IU]/min | INTRAVENOUS | Status: DC
  Administered 2020-07-28: 2 m[IU]/min via INTRAVENOUS
  Administered 2020-07-28: 6 m[IU]/min via INTRAVENOUS
  Filled 2020-07-28: qty 500

## 2020-07-28 MED ORDER — ISONIAZID 300 MG PO TABS
300.0000 mg | ORAL_TABLET | Freq: Every day | ORAL | Status: DC
  Administered 2020-07-28: 300 mg via ORAL
  Filled 2020-07-28: qty 1

## 2020-07-28 MED ORDER — VITAMIN B-6 50 MG PO TABS
50.0000 mg | ORAL_TABLET | Freq: Every day | ORAL | Status: DC
  Administered 2020-07-28: 50 mg via ORAL
  Filled 2020-07-28: qty 1

## 2020-07-28 MED ORDER — MISOPROSTOL 200 MCG PO TABS
ORAL_TABLET | ORAL | Status: AC
  Filled 2020-07-28: qty 4

## 2020-07-28 MED ORDER — ZOLPIDEM TARTRATE 5 MG PO TABS: 5.0000 mg | ORAL_TABLET | Freq: Every evening | ORAL | Status: DC | PRN

## 2020-07-28 MED ORDER — OXYCODONE-ACETAMINOPHEN 5-325 MG PO TABS: 2.0000 | ORAL_TABLET | ORAL | Status: DC | PRN

## 2020-07-28 MED ORDER — EPHEDRINE 5 MG/ML INJ
10.0000 mg | INTRAVENOUS | Status: AC | PRN
  Administered 2020-07-28 (×2): 10 mg via INTRAVENOUS

## 2020-07-28 MED ORDER — LACTATED RINGERS IV SOLN
500.0000 mL | INTRAVENOUS | Status: DC | PRN
  Administered 2020-07-28: 1000 mL via INTRAVENOUS
  Administered 2020-07-28: 500 mL via INTRAVENOUS

## 2020-07-28 MED ORDER — DIPHENHYDRAMINE HCL 50 MG/ML IJ SOLN
12.5000 mg | INTRAMUSCULAR | Status: DC | PRN
Start: 2020-07-28 — End: 2020-07-29

## 2020-07-28 MED ORDER — FLEET ENEMA 7-19 GM/118ML RE ENEM: 1.0000 | ENEMA | Freq: Every day | RECTAL | Status: DC | PRN

## 2020-07-28 MED ORDER — FENTANYL-BUPIVACAINE-NACL 0.5-0.125-0.9 MG/250ML-% EP SOLN
12.0000 mL/h | EPIDURAL | Status: DC | PRN
  Administered 2020-07-28: 12 mL/h via EPIDURAL
  Filled 2020-07-28: qty 250

## 2020-07-28 MED ORDER — MISOPROSTOL 25 MCG QUARTER TABLET: 25.0000 ug | ORAL_TABLET | ORAL | Status: DC | PRN

## 2020-07-28 MED ORDER — OXYCODONE-ACETAMINOPHEN 5-325 MG PO TABS: 1.0000 | ORAL_TABLET | ORAL | Status: DC | PRN

## 2020-07-28 MED ORDER — LACTATED RINGERS IV SOLN
500.0000 mL | Freq: Once | INTRAVENOUS | Status: AC
  Administered 2020-07-28: 500 mL via INTRAVENOUS

## 2020-07-28 MED ORDER — ACETAMINOPHEN 325 MG PO TABS: 650.0000 mg | ORAL_TABLET | ORAL | Status: DC | PRN

## 2020-07-28 MED ORDER — FENTANYL CITRATE (PF) 100 MCG/2ML IJ SOLN
50.0000 ug | INTRAMUSCULAR | Status: DC | PRN
  Administered 2020-07-28: 100 ug via INTRAVENOUS
  Administered 2020-07-28: 50 ug via INTRAVENOUS
  Filled 2020-07-28 (×2): qty 2

## 2020-07-28 MED ORDER — TERBUTALINE SULFATE 1 MG/ML IJ SOLN
0.2500 mg | Freq: Once | INTRAMUSCULAR | Status: DC | PRN
Start: 2020-07-28 — End: 2020-07-29

## 2020-07-28 MED ORDER — OXYTOCIN BOLUS FROM INFUSION
333.0000 mL | Freq: Once | INTRAVENOUS | Status: AC
  Administered 2020-07-28: 333 mL via INTRAVENOUS

## 2020-07-28 MED ORDER — HYDROXYZINE HCL 50 MG PO TABS: 50.0000 mg | ORAL_TABLET | Freq: Four times a day (QID) | ORAL | Status: DC | PRN

## 2020-07-28 MED ORDER — PHENYLEPHRINE 40 MCG/ML (10ML) SYRINGE FOR IV PUSH (FOR BLOOD PRESSURE SUPPORT)
80.0000 ug | PREFILLED_SYRINGE | INTRAVENOUS | Status: DC | PRN
  Filled 2020-07-28: qty 10

## 2020-07-28 MED ORDER — LIDOCAINE HCL (PF) 1 % IJ SOLN: 30.0000 mL | INTRAMUSCULAR | Status: DC | PRN

## 2020-07-28 MED ORDER — ONDANSETRON HCL 4 MG/2ML IJ SOLN
4.0000 mg | Freq: Four times a day (QID) | INTRAMUSCULAR | Status: DC | PRN
Start: 2020-07-28 — End: 2020-07-29
  Filled 2020-07-28: qty 2

## 2020-07-28 MED ORDER — LACTATED RINGERS IV SOLN: INTRAVENOUS | Status: DC

## 2020-07-28 MED ORDER — LIDOCAINE-EPINEPHRINE (PF) 2 %-1:200000 IJ SOLN
INTRAMUSCULAR | Status: DC | PRN
  Administered 2020-07-28: 4 mL via EPIDURAL

## 2020-07-28 MED ORDER — EPHEDRINE 5 MG/ML INJ
10.0000 mg | INTRAVENOUS | Status: DC | PRN
  Filled 2020-07-28: qty 10

## 2020-07-28 NOTE — Anesthesia Preprocedure Evaluation (Signed)
Anesthesia Evaluation  Patient identified by MRN, date of birth, ID band Patient awake    Reviewed: Allergy & Precautions, NPO status , Patient's Chart, lab work & pertinent test results  Airway Mallampati: II  TM Distance: >3 FB Neck ROM: Full    Dental   Pulmonary neg pulmonary ROS,    Pulmonary exam normal        Cardiovascular negative cardio ROS   Rhythm:Regular Rate:Normal     Neuro/Psych negative neurological ROS  negative psych ROS   GI/Hepatic negative GI ROS, Neg liver ROS,   Endo/Other  negative endocrine ROS  Renal/GU negative Renal ROS  negative genitourinary   Musculoskeletal negative musculoskeletal ROS (+)   Abdominal (+)  Abdomen: soft. Bowel sounds: normal.  Peds  Hematology  (+) anemia ,   Anesthesia Other Findings   Reproductive/Obstetrics (+) Pregnancy                             Anesthesia Physical Anesthesia Plan  ASA: II  Anesthesia Plan: Epidural   Post-op Pain Management:    Induction:   PONV Risk Score and Plan: 2 and Treatment may vary due to age or medical condition  Airway Management Planned: Natural Airway  Additional Equipment: None  Intra-op Plan:   Post-operative Plan:   Informed Consent: I have reviewed the patients History and Physical, chart, labs and discussed the procedure including the risks, benefits and alternatives for the proposed anesthesia with the patient or authorized representative who has indicated his/her understanding and acceptance.     Interpreter used for [employer] and Sales promotion account executive given  Plan Discussed with:   Anesthesia Plan Comments: (Lab Results      Component                Value               Date                      WBC                      7.4                 07/28/2020                HGB                      9.2 (L)             07/28/2020                HCT                      30.8 (L)             07/28/2020                MCV                      75.7 (L)            07/28/2020                PLT                      229  07/28/2020          )        Anesthesia Quick Evaluation

## 2020-07-28 NOTE — Progress Notes (Signed)
Labor Progress Note Jean Myers is a 25 y.o. G3P2002 at [redacted]w[redacted]d presented for IOL-PD, TOLAC. S: Doing well, feeling contractions.  O:  BP (!) 104/91   Pulse 77   Temp 97.8 F (36.6 C) (Oral)   Resp 14   Ht 5' 2.99" (1.6 m)   Wt 82.5 kg   LMP  (LMP Unknown)   BMI 32.21 kg/m  EFM: baseline 120bpm/mod variability/+accels/no decels Toco: q2-5 min  CVE: Dilation: 5 Effacement (%): 70 Station: -2 Presentation: Vertex Exam by:: Dr. Germaine Pomfret   A&P: 25 y.o. [id number] [redacted]w[redacted]d presented for IOL-PD, TOLAC. #IOL, TOLAC: Patient has made good cervical change since last exam, currently on pitocin 34mL/hr. AROM performed at this check with light meconium stained fluid. Will titrate pitocin 2x2. #Pain: PRN, discussed epidural with patient, patient unsure, anesthesiologist will report to bedside to discuss  #FWB: cat 1 #GBS negative   #Late prenatal care/refugee: SW postpartum  #Anemia of pregnancy: admit hgb 9.6, IV vs PO iron postpartum pending EBL  #Language barrier: speaks Dari, used [employer] interpreter for entirety of visit  #History of cesarean section: stated scheduled for fetal macrosomia, 3856g. No history of VBAC. History of vaginal delivery, 3629g. Both prior deliveries in [place].   #Latent tuberculosis: diagnosed at refugee [place] in [place], has undergone treatment x6 months. Home med regimen ordered, continue postpartum.    Alric Seton, MD 4:34 PM

## 2020-07-28 NOTE — Discharge Summary (Signed)
Postpartum Discharge Summary  Date of Service updated 07/30/20     Patient Name: Jean Myers DOB: 1995/06/08 MRN: [id number]  Date of admission: 07/28/2020 Delivery date:07/28/2020  Delivering provider: Christin Fudge  Date of discharge: 07/30/2020  Admitting diagnosis: Post term pregnancy over 40 weeks [O48.0] Intrauterine pregnancy: [redacted]w[redacted]d    Secondary diagnosis:  Active Problems:   TB lung, latent   Hx of cesarean section complicating pregnancy   Language barrier affecting health care   Iron deficiency anemia   Supervision of other normal pregnancy, antepartum   Anemia in pregnancy   Post term pregnancy over 40 weeks   Late prenatal care  Additional problems: [place]   Discharge diagnosis: VBAC                                              Post partum procedures:IV iron Augmentation: AROM and Pitocin Complications: [id number] Hospital course: Induction of Labor With Vaginal Delivery   25y.o. yo [id number] admitted to the hospital 07/28/2020 for induction of labor.  Indication for induction: postdates.  Patient had an uncomplicated labor course as follows: Membrane Rupture Time/Date: 3:55 PM ,07/28/2020   Delivery Method:VBAC, Vacuum Assisted  Episiotomy: None  Lacerations:  2nd degree;Perineal  Details of delivery can be found in separate delivery note.  Patient had a routine postpartum course. Patient is discharged home 07/30/20. PPH, Hgb 9.2>7.7, received IV Venofer, will continue po Fe QOD at home, asymptomatic.  Continue meds for latent tb  Newborn Data: Birth date:07/28/2020  Birth time:10:27 PM  Gender:Female  Living status:Living  Apgars:8 ,9  Weight:4355 g   Magnesium Sulfate received: No BMZ received: No Rhophylac:N/A MMR:N/A T-DaP:Given prenatally Flu: Yes Transfusion:No  Physical exam  Vitals:   07/29/20 0503 07/29/20 1050 07/29/20 2138 07/30/20 0541  BP: (!) 103/53 (!) 97/59 (!) 97/55 (!) 97/52  Pulse: 99 90 63 73  Resp:  18 16 15 18   Temp: 99.8 F (37.7 C) 98.2 F (36.8 C) 98.1 F (36.7 C) 97.9 F (36.6 C)  TempSrc: Oral Oral Oral Oral  SpO2: 100% 100% 98% 99%  Weight:      Height:       General: alert, cooperative and no distress Lochia: appropriate Uterine Fundus: firm Incision: N/A DVT Evaluation: No evidence of DVT seen on physical exam. Negative Homan's sign. No cords or calf tenderness. No significant calf/ankle edema. Labs: Lab Results  Component Value Date   WBC 12.4 (H) 07/29/2020   HGB 7.7 (L) 07/29/2020   HCT 25.1 (L) 07/29/2020   MCV 75.1 (L) 07/29/2020   PLT 189 07/29/2020   CMP Latest Ref Rng & Units 06/07/2020  Glucose 70 - 99 mg/dL 98  BUN 6 - 20 mg/dL 6  Creatinine 0.44 - 1.00 mg/dL 0.44  Sodium 135 - 145 mmol/L 135  Potassium 3.5 - 5.1 mmol/L 3.8  Chloride 98 - 111 mmol/L 103  CO2 22 - 32 mmol/L 22  Calcium 8.9 - 10.3 mg/dL 8.3(L)  Total Protein 6.5 - 8.1 g/dL 6.1(L)  Total Bilirubin 0.3 - 1.2 mg/dL 1.0  Alkaline Phos 38 - 126 U/L 102  AST 15 - 41 U/L 32  ALT 0 - 44 U/L 23   Edinburgh Score: No flowsheet data found.   After visit meds:  Allergies as of 07/30/2020   No Known Allergies  Medication List    STOP taking these medications   diphenhydrAMINE 25 MG tablet Commonly known as: BENADRYL   docusate sodium 100 MG capsule Commonly known as: COLACE   PNV Prenatal Plus Multivitamin 27-1 MG Tabs     TAKE these medications   ibuprofen 600 MG tablet Commonly known as: ADVIL Take 1 tablet (600 mg total) by mouth every 6 (six) hours.   iron polysaccharides 150 MG capsule Commonly known as: NIFEREX Take 1 capsule (150 mg total) by mouth every other day.   isoniazid 300 MG tablet Commonly known as: NYDRAZID Take by mouth daily.   pyridOXINE 50 MG tablet Commonly known as: VITAMIN B-6 Take 1 tablet (50 mg total) by mouth daily.   rifampin 300 MG capsule Commonly known as: RIFADIN Take 600 mg by mouth.        Discharge home in stable  condition Infant Feeding: Breast Infant Disposition:home with mother Discharge instruction: per After Visit Summary and Postpartum booklet. Activity: Advance as tolerated. Pelvic rest for 6 weeks.  Diet: routine diet Future Appointments: Future Appointments  Date Time Provider [place]  08/25/2020  2:55 PM Starr Lake, CNM [facility]   Follow up Visit:  [facility] for [facility] at [facility] Follow up.   Specialty: Obstetrics and Gynecology Why: call to schedule postpartum visit for 4-6 weeks Contact information: [place] [id number] [phone]               Please schedule this patient for a In person postpartum visit in 4 weeks with the following provider: Any provider. Additional Postpartum F/U:  Low risk pregnancy complicated by:  Delivery mode:  VBAC, Vacuum Assisted  Anticipated Birth Control:  PP Depo given   07/30/2020 Roma Schanz, CNM

## 2020-07-28 NOTE — Progress Notes (Signed)
Patient Vitals for the past 4 hrs:  BP Temp Temp src Pulse Resp SpO2  07/28/20 2120 -- -- -- -- -- 100 %  07/28/20 2115 -- -- -- -- -- 99 %  07/28/20 2110 -- -- -- -- -- 100 %  07/28/20 2105 -- -- -- -- -- 100 %  07/28/20 2100 108/66 -- -- 100 -- 100 %  07/28/20 2056 106/63 -- -- (!) 105 18 --  07/28/20 2055 -- -- -- -- -- 100 %  07/28/20 2050 -- -- -- -- -- 100 %  07/28/20 2045 -- -- -- -- -- 100 %  07/28/20 2040 -- -- -- -- -- 100 %  07/28/20 1930 (!) 110/55 98 F (36.7 C) Oral 96 18 --  07/28/20 1900 (!) 95/46 -- -- 82 -- --  07/28/20 1841 (!) 103/43 -- -- 64 -- --  07/28/20 1830 (!) 88/45 -- -- 68 -- --  07/28/20 1802 -- -- -- -- -- 97 %  07/28/20 1800 (!) 100/43 -- -- 70 -- --  07/28/20 1758 (!) 90/49 -- -- (!) 58 -- --  07/28/20 1755 (!) 88/47 -- -- (!) 59 -- --  07/28/20 1735 (!) 94/53 -- -- 75 -- 96 %   No c/o pressure or pain.  FHR Cat 1.  Ctx q 3-5 minutes  Pitocin at 10 mu/min.  Will increase Pit to improve labor pattern, labor down in different positions to facilitate rotation (? OP per RN exam).

## 2020-07-28 NOTE — H&P (Signed)
OBSTETRIC ADMISSION HISTORY AND PHYSICAL  Jean Myers is a 25 y.o. female G54P2002 with IUP at [redacted]w[redacted]d by 33wk u/s presenting for IOL-PD, TOLAC. She reports +FMs, No LOF, no VB, no blurry vision, headaches or peripheral edema, and RUQ pain.  She plans on breast feeding. She request depo for birth control. She received her prenatal care at [facility]   Dating: By 33 wk u/s --->  Estimated Date of Delivery: 07/23/20  Sono:    07/04/20@[redacted]w[redacted]d , CWD, normal anatomy, cephalic presentation, posterior fundal placental lie, 3485g, 84% EFW   Prenatal History/Complications:  History of cesarean section x1 (fetal macrosomia) Anemia of pregnancy (admit hgb 9.2) Language barrier (Dari) Late prenatal care (presented to [facility] 7 mos) Refugee   Past Medical History: Past Medical History:  Diagnosis Date  . 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  obtaining treatment through [facility]--they come to her home every morning for observed treatment.    Past Surgical History: Past Surgical History:  Procedure Laterality Date  . CESAREAN SECTION      Obstetrical History: OB History    Gravida  3   Para  2   Term  2   Preterm  0   AB  0   Living  2     SAB  0   IAB  0   Ectopic  0   Multiple  0   Live Births  2           Social History Social History   Socioeconomic History  . Marital status: Married    Spouse name: Khailee Mick   . Number of children: 2  . Years of education: Not on file  . Highest education level: Not on file  Occupational History  . Not on file  Tobacco Use  . Smoking status: Never Smoker  . Smokeless tobacco: Never Used  Vaping Use  . Vaping Use: Never used  Substance and Sexual Activity  . Alcohol use: Never  . Drug use: Never  . Sexual activity: Yes    Birth control/protection: None  Other Topics Concern  . Not on file  Social History Narrative  . Not on file   Social Determinants of Health   Financial Resource Strain: Not  on file  Food Insecurity: Food Insecurity Present  . Worried About Programme researcher, broadcasting/film/video in the Last Year: Sometimes true  . Ran Out of Food in the Last Year: Sometimes true  Transportation Needs: Unmet Transportation Needs  . Lack of Transportation (Medical): Yes  . Lack of Transportation (Non-Medical): Yes  Physical Activity: Not on file  Stress: Not on file  Social Connections: Not on file    Family History: History reviewed. No pertinent family history.  Allergies: No Known Allergies  Medications Prior to Admission  Medication Sig Dispense Refill Last Dose  . iron polysaccharides (NIFEREX) 150 MG capsule Take 1 capsule (150 mg total) by mouth every other day. 30 capsule 3 07/27/2020 at Unknown time  . isoniazid (NYDRAZID) 300 MG tablet Take by mouth daily.   07/27/2020 at Unknown time  . Prenatal Vit-Fe Fumarate-FA (PNV PRENATAL PLUS MULTIVITAMIN) 27-1 MG TABS 1 tab by mouth daily 30 tablet 11 07/27/2020 at Unknown time  . rifampin (RIFADIN) 300 MG capsule Take 600 mg by mouth.   07/27/2020 at Unknown time  . diphenhydrAMINE (BENADRYL) 25 MG tablet Take 1-2 tablets (25-50 mg total) by mouth every 6 (six) hours as needed for itching or allergies. (Patient not  taking: Reported on 07/24/2020) 30 tablet 2   . docusate sodium (COLACE) 100 MG capsule Take 1 capsule (100 mg total) by mouth 2 (two) times daily as needed for mild constipation or moderate constipation. (Patient not taking: Reported on 07/24/2020) 30 capsule 2   . pyridOXINE (VITAMIN B-6) 50 MG tablet Take 50 mg by mouth daily. (Patient not taking: Reported on 07/24/2020)        Review of Systems   All systems reviewed and negative except as stated in HPI  Blood pressure (!) 107/58, pulse 75, temperature 97.9 F (36.6 C), temperature source Oral, resp. rate 14, height 5' 2.99" (1.6 m), weight 82.5 kg. General appearance: alert, cooperative and no distress Lungs: normal respiratory effort Heart: regular rate and rhythm Abdomen:  soft, non-tender; gravid Pelvic: as noted below Extremities: Homans sign is negative, no sign of DVT Presentation: cephalic on cervical exam and previously RN bedside u/s Fetal monitoringBaseline: 130 bpm, Variability: Good {> 6 bpm), Accelerations: Reactive and Decelerations: Absent Uterine activityNone Dilation: 2 Effacement (%): 50 Station: -2 Exam by:: Dr. Germaine Pomfret   Prenatal labs: ABO, Rh: --/--/A POS (04/04 1044) Antibody: NEG (04/04 1044) Rubella: 1.49 (02/23 1147) RPR: Non Reactive (03/03 0953)  HBsAg: Negative (02/23 1147)  HIV: Non Reactive (03/03 0953)  GBS: Negative/-- (03/03 1151)  2 hr Glucola passed Genetic screening not done (late to care) Anatomy US normal  Prenatal Transfer Tool  Maternal Diabetes: No Genetic Screening: Declined (late to care) Maternal Ultrasounds/Referrals: Normal Fetal Ultrasounds or other Referrals:  None Maternal Substance Abuse:  No Significant Maternal Medications:  None Significant Maternal Lab Results: Group B Strep negative  Results for orders placed or performed during the hospital encounter of 07/28/20 (from the past 24 hour(s))  CBC   Collection Time: 07/28/20 10:06 AM  Result Value Ref Range   WBC 7.4 4.0 - 10.5 K/uL   RBC 4.07 3.87 - 5.11 MIL/uL   Hemoglobin 9.2 (L) 12.0 - 15.0 g/dL   HCT 25.9 (L) 56.3 - 87.5 %   MCV 75.7 (L) 80.0 - 100.0 fL   MCH 22.6 (L) 26.0 - 34.0 pg   MCHC 29.9 (L) 30.0 - 36.0 g/dL   RDW 64.3 (H) 32.9 - 51.8 %   Platelets 229 150 - 400 K/uL   nRBC 0.0 0.0 - 0.2 %  Type and screen   Collection Time: 07/28/20 10:44 AM  Result Value Ref Range   ABO/RH(D) A POS    Antibody Screen NEG    Sample Expiration      07/31/2020,2359 Performed at [facility], [address], [place], [place] [address]     Patient Active Problem List   Diagnosis Date Noted  . Post term pregnancy over 40 weeks 07/28/2020  . Anemia in pregnancy 07/02/2020  . Supervision of other normal pregnancy, antepartum  06/18/2020  . Encounter for health examination of refugee 06/08/2020  . Hx of cesarean section complicating pregnancy 06/08/2020  . Language barrier affecting health care 06/08/2020  . Iron deficiency anemia 06/08/2020  . TB lung, latent 12/2019    Assessment/Plan:  Chalyn Amescua is a 25 y.o. G3P2002 at [redacted]w[redacted]d here for IOL-PD, TOLAC.  #IOL, TOLAC: Discussed IOL process with patient. Will start low dose pitocin 2x2 up to 42mL/hr. Will re-check in 4 hours. Given cervical exam and prior vaginal delivery will defer FB at this time. #Pain: PRN #FWB:  cat 1 #ID: GBS neg #MOF: breast #MOC: depo #Circ: n/a  #Late prenatal care/refugee: SW postpartum  #  Anemia of pregnancy: admit hgb 9.6, IV vs PO iron postpartum pending EBL  #Language barrier: speaks Dari, used [employer] interpreter for entirety of visit  #History of cesarean section: stated scheduled for fetal macrosomia, 3856g. No history of VBAC. History of vaginal delivery, 3629g. Both prior deliveries in [place].   Alric Seton, MD  07/28/2020, 12:02 PM

## 2020-07-28 NOTE — Anesthesia Procedure Notes (Addendum)
Epidural Patient location during procedure: OB Start time: 07/28/2020 4:25 PM End time: 07/28/2020 4:33 PM  Staffing Anesthesiologist: Atilano Median, DO Performed: anesthesiologist   Preanesthetic Checklist Completed: patient identified, IV checked, site marked, risks and benefits discussed, surgical consent, monitors and equipment checked, pre-op evaluation and timeout performed  Epidural Patient position: sitting Prep: ChloraPrep Patient monitoring: heart rate, continuous pulse ox and blood pressure Approach: midline Location: L3-L4 Injection technique: LOR saline  Needle:  Needle type: Tuohy  Needle gauge: 17 G Needle length: 9 cm Needle insertion depth: 8 cm Catheter type: closed end flexible Catheter size: 20 Guage Catheter at skin depth: 12 cm Test dose: negative and 1.5% lidocaine  Assessment Events: blood not aspirated, injection not painful, no injection resistance and no paresthesia  Additional Notes Patient identified. Risks/Benefits/Options discussed with patient including but not limited to bleeding, infection, nerve damage, paralysis, failed block, incomplete pain control, headache, blood pressure changes, nausea, vomiting, reactions to medications, itching and postpartum back pain. Confirmed with bedside nurse the patient's most recent platelet count. Confirmed with patient that they are not currently taking any anticoagulation, have any bleeding history or any family history of bleeding disorders. Patient expressed understanding and wished to proceed. All questions were answered. Sterile technique was used throughout the entire procedure. Please see nursing notes for vital signs. Test dose was given through epidural catheter and negative prior to continuing to dose epidural or start infusion. Warning signs of high block given to the patient including shortness of breath, tingling/numbness in hands, complete motor block, or any concerning symptoms with instructions  to call for help. Patient was given instructions on fall risk and not to get out of bed. All questions and concerns addressed with instructions to call with any issues or inadequate analgesia.    Reason for block:procedure for pain

## 2020-07-28 NOTE — Lactation Note (Signed)
This note was copied from a baby's chart. Lactation Consultation Note Baby < than hr old at time of consult. Mom holding baby STS. D/t mom's position LC placed baby in football hold. Baby held nipple in mouth suckle twice then held nipple in mouth.  Mom shaking bad. FOB interpret for mom. Mom wanted me to take baby because she was really cold and felt sick. LC took baby wrapped in blanket, asked FOB to give her emesis bag. LC called for RN.  LC will f/u on MBU. Mom doesn't feel well at this time.  Patient Name: Jean Myers WLSLH'T Date: 07/28/2020   Age:32 hours  Maternal Data    Feeding    LATCH Score                    Lactation Tools Discussed/Used    Interventions    Discharge    Consult Status      [name] 07/28/2020, 11:32 PM

## 2020-07-29 LAB — CBC
HCT: 25.1 % — ABNORMAL LOW (ref 36.0–46.0)
Hemoglobin: 7.7 g/dL — ABNORMAL LOW (ref 12.0–15.0)
MCH: 23.1 pg — ABNORMAL LOW (ref 26.0–34.0)
MCHC: 30.7 g/dL (ref 30.0–36.0)
MCV: 75.1 fL — ABNORMAL LOW (ref 80.0–100.0)
Platelets: 189 10*3/uL (ref 150–400)
RBC: 3.34 MIL/uL — ABNORMAL LOW (ref 3.87–5.11)
RDW: 16.6 % — ABNORMAL HIGH (ref 11.5–15.5)
WBC: 12.4 10*3/uL — ABNORMAL HIGH (ref 4.0–10.5)
nRBC: 0 % (ref 0.0–0.2)

## 2020-07-29 LAB — RPR: RPR Ser Ql: NONREACTIVE

## 2020-07-29 MED ORDER — DIPHENHYDRAMINE HCL 25 MG PO CAPS: 25.0000 mg | ORAL_CAPSULE | Freq: Four times a day (QID) | ORAL | Status: DC | PRN

## 2020-07-29 MED ORDER — RIFAMPIN 300 MG PO CAPS
600.0000 mg | ORAL_CAPSULE | Freq: Every day | ORAL | Status: DC
  Administered 2020-07-29 – 2020-07-30 (×2): 600 mg via ORAL
  Filled 2020-07-29 (×3): qty 2

## 2020-07-29 MED ORDER — DIBUCAINE (PERIANAL) 1 % EX OINT: 1.0000 "application " | TOPICAL_OINTMENT | CUTANEOUS | Status: DC | PRN

## 2020-07-29 MED ORDER — DOCUSATE SODIUM 100 MG PO CAPS
100.0000 mg | ORAL_CAPSULE | Freq: Two times a day (BID) | ORAL | Status: DC
  Administered 2020-07-29 – 2020-07-30 (×3): 100 mg via ORAL
  Filled 2020-07-29 (×3): qty 1

## 2020-07-29 MED ORDER — FERROUS SULFATE 325 (65 FE) MG PO TABS
325.0000 mg | ORAL_TABLET | ORAL | Status: DC
  Administered 2020-07-29: 325 mg via ORAL
  Filled 2020-07-29: qty 1

## 2020-07-29 MED ORDER — METHYLERGONOVINE MALEATE 0.2 MG/ML IJ SOLN
0.2000 mg | INTRAMUSCULAR | Status: DC | PRN
Start: 2020-07-28 — End: 2020-07-30

## 2020-07-29 MED ORDER — COCONUT OIL OIL: 1.0000 "application " | TOPICAL_OIL | Status: DC | PRN

## 2020-07-29 MED ORDER — FLEET ENEMA 7-19 GM/118ML RE ENEM: 1.0000 | ENEMA | Freq: Every day | RECTAL | Status: DC | PRN

## 2020-07-29 MED ORDER — ONDANSETRON HCL 4 MG PO TABS: 4.0000 mg | ORAL_TABLET | ORAL | Status: DC | PRN

## 2020-07-29 MED ORDER — MEDROXYPROGESTERONE ACETATE 150 MG/ML IM SUSP: 150.0000 mg | INTRAMUSCULAR | Status: DC | PRN

## 2020-07-29 MED ORDER — PRENATAL MULTIVITAMIN CH
1.0000 | ORAL_TABLET | Freq: Every day | ORAL | Status: DC
  Administered 2020-07-29 – 2020-07-30 (×2): 1 via ORAL
  Filled 2020-07-29 (×2): qty 1

## 2020-07-29 MED ORDER — ACETAMINOPHEN 325 MG PO TABS
650.0000 mg | ORAL_TABLET | ORAL | Status: DC | PRN
  Administered 2020-07-29 – 2020-07-30 (×3): 650 mg via ORAL
  Filled 2020-07-29 (×3): qty 2

## 2020-07-29 MED ORDER — WITCH HAZEL-GLYCERIN EX PADS: 1.0000 "application " | MEDICATED_PAD | CUTANEOUS | Status: DC | PRN

## 2020-07-29 MED ORDER — METHYLERGONOVINE MALEATE 0.2 MG PO TABS
0.2000 mg | ORAL_TABLET | ORAL | Status: DC | PRN
Start: 2020-07-28 — End: 2020-07-30

## 2020-07-29 MED ORDER — TETANUS-DIPHTH-ACELL PERTUSSIS 5-2.5-18.5 LF-MCG/0.5 IM SUSY: 0.5000 mL | PREFILLED_SYRINGE | Freq: Once | INTRAMUSCULAR | Status: DC

## 2020-07-29 MED ORDER — ONDANSETRON HCL 4 MG/2ML IJ SOLN: 4.0000 mg | INTRAMUSCULAR | Status: DC | PRN

## 2020-07-29 MED ORDER — SIMETHICONE 80 MG PO CHEW: 80.0000 mg | CHEWABLE_TABLET | ORAL | Status: DC | PRN

## 2020-07-29 MED ORDER — SODIUM CHLORIDE 0.9 % IV SOLN
500.0000 mg | Freq: Once | INTRAVENOUS | Status: AC
  Administered 2020-07-29: 500 mg via INTRAVENOUS
  Filled 2020-07-29: qty 25

## 2020-07-29 MED ORDER — SODIUM CHLORIDE 0.9 % IV SOLN: 500.0000 mg | INTRAVENOUS | Status: DC

## 2020-07-29 MED ORDER — BISACODYL 10 MG RE SUPP: 10.0000 mg | Freq: Every day | RECTAL | Status: DC | PRN

## 2020-07-29 MED ORDER — IBUPROFEN 600 MG PO TABS
600.0000 mg | ORAL_TABLET | Freq: Four times a day (QID) | ORAL | Status: DC
  Administered 2020-07-29 – 2020-07-30 (×6): 600 mg via ORAL
  Filled 2020-07-29 (×6): qty 1

## 2020-07-29 MED ORDER — BENZOCAINE-MENTHOL 20-0.5 % EX AERO
1.0000 "application " | INHALATION_SPRAY | CUTANEOUS | Status: DC | PRN
  Filled 2020-07-29: qty 56

## 2020-07-29 MED ORDER — VITAMIN B-6 50 MG PO TABS
50.0000 mg | ORAL_TABLET | Freq: Every day | ORAL | Status: DC
  Administered 2020-07-29 – 2020-07-30 (×2): 50 mg via ORAL
  Filled 2020-07-29 (×3): qty 1

## 2020-07-29 MED ORDER — ISONIAZID 300 MG PO TABS
300.0000 mg | ORAL_TABLET | Freq: Every day | ORAL | Status: DC
  Administered 2020-07-29 – 2020-07-30 (×2): 300 mg via ORAL
  Filled 2020-07-29 (×3): qty 1

## 2020-07-29 MED ORDER — MEASLES, MUMPS & RUBELLA VAC IJ SOLR
0.5000 mL | Freq: Once | INTRAMUSCULAR | Status: DC
Start: 2020-07-29 — End: 2020-07-30

## 2020-07-29 NOTE — Clinical Social Work Maternal (Signed)
CLINICAL SOCIAL WORK MATERNAL/CHILD NOTE  Patient Details  Name: Welda Azzarello MRN: [id number] Date of Birth: [birth year]  Date:  07/29/2020  Clinical Social Worker Initiating Note:  Darra Lis, MSW, [place] Date/Time: Initiated:  07/29/20/0930     Child's Name:  Gracy Bruins   Biological Parents:  Mother,Father Sela Hilding)   Need for Interpreter:  Other (Comment Required) Caprice Beaver)   Reason for Referral:  Late or No Prenatal Care    Address:  [address] [place] [address]    Phone number:  [phone] (home)     Additional phone number:   Household Members/Support Persons (HM/SP):   Household Member/Support Person 1,Household Member/Support Person 2,Household Member/Support Person 3   HM/SP Name Relationship DOB or Age  HM/SP -1 Yasin Burges Spouse 30  HM/SP -2 Asra Robb Daughter 4  HM/SP -3 Husna Revuelta Daughter 2  HM/SP -4        HM/SP -5        HM/SP -6        HM/SP -7        HM/SP -8          Natural Supports (not living in the home):      Professional Supports: Other (Comment) Lobbyist)   Employment: Unemployed   Type of Work:     Education:  Other (comment) (8th Grade)   Homebound arranged:    Museum/gallery curator Resources:  Medicaid   Other Resources:  Physicist, medical  (Contacted WIC & had an appointment scheduled)   Cultural/Religious Considerations Which May Impact Care:    Strengths:      Psychotropic Medications:         Pediatrician:       Pediatrician List:   [place]      Pediatrician Fax Number:    Risk Factors/Current Problems:  Scientist, product/process development State:  Linear Thinking ,Alert ,Insightful    Mood/Affect:  Calm ,Interested    CSW Assessment: CSW consulted for late prenatal care at 35w. CSW met with MOB to complete assessment and offer support. CSW utilized Systems developer [phone] and [place] AM[employer]  [place] #[id number]. MOB provided permission to have FOB remain in room for assessment. CSW informed MOB of reason for consult. MOB reported she had a good pregnancy. MOB stated she received prenatal care throughout the pregnancy. FOB shared that MOB had prenatal care on the [facility] in [place], [place]. FOB stated MOB had care since 2 months, but the prenatal records were not sent when they relocated to [facility]. FOB disclosed they also experienced issues considering MOB initially had no insurance. MOB was later able to obtain insurance.The family also recently relocated from [place]. CSW informed MOB of the hospital drug screen policy. MOB aware a CPS report will be made if infant test positive for substances. MOB expressed understanding and denies any substance use.   MOB is currently unemployed and receiving food stamp benefits. Permission was provided for CSW to contact [facility] and have an appointment scheduled for family. MOB denies any mental health history and reported she is currently feeling well. MOB denies any current SI or HI. CSW asked about supports. FOB stated they have minimum supports. FOB shared they were involved with [employer] and were able to get assistance with healthcare and housing. FOB expressed frustration with trying to  get a key for their apartment mailbox and not having a work permit. CSW asked if the agency is able to assist with a work permit. FOB reported the agency is the in the process of closing their case. CSW contacted [employer] to see if they are able to assist FOB with obtaining a work permit. CSW was informed they are unable to provide assistance due to families being assigned by the government once they enter the country. The representative stated the family's current agency should be assisting with that process.  FOB stated they are able to meet the needs of their children and have all necessary items for infant,  including a car seat.   CSW provided education regarding the baby blues period versus PPD. CSW unable to provide the New Mom Checklist due to not having a Dari translation. CSW encouraged MOB to contact a medical professional if PP symptoms discussed are noted at any time. CSW provided review of Sudden Infant Death Syndrome (SIDS) precautions. MOB stated infant has her own bed for sleeping. MOB reported they do not have transportation. CSW provided information on [employer] and had MOB sign the rider liability form. CSW informed family that transportation can be arranged for infant's follow-up appointment.   CSW will continue to follow CDS and make a CPS report if warranted. CSW identifies no further need for intervention and no barriers to discharge at this time.  CSW Plan/Description:  CSW Will Continue to Monitor Umbilical Cord Tissue Drug Screen Results and Make Report if Warranted,Child Protective Service Report ,Hospital Drug Screen Policy Information,Perinatal Mood and Anxiety Disorder (PMADs) Education,Sudden Infant Death Syndrome (SIDS) Education,Other Information/Referral to [employer] Resources,No Further Intervention Required/No Barriers to Discharge    Waylan Boga, [place] 07/29/2020, 10:59 AM

## 2020-07-29 NOTE — Progress Notes (Signed)
Post Partum Day 1 Subjective:  Patient is doing well without complaints. Ambulating without difficulty. Voiding and passing flatus. Tolerating PO. Abdominal pain improved. Vaginal bleeding decreased.  Objective: Blood pressure (!) 97/59, pulse 90, temperature 98.2 F (36.8 C), temperature source Oral, resp. rate 16, height 5' 2.99" (1.6 m), weight 82.5 kg, SpO2 100 %, unknown if currently breastfeeding.  Physical Exam:  General: alert, cooperative and no distress Lochia: appropriate Uterine Fundus: firm Incision: n/a DVT Evaluation: No evidence of DVT seen on physical exam.  Recent Labs    07/28/20 1006 07/29/20 0800  HGB 9.2* 7.7*  HCT 30.8* 25.1*    Assessment/Plan: PPD#1 VAVD, VBAC  -doing well, meeting pp milestones  -VSS  -depo for contraception, ordered  #PPH/Acute blood loss anemia  -hgb 9.2>7.7, received IV Venofer  -asymptomatic  -discharge with PO iron  #Language barrier  -offered stratus interpreter, declined  -husband interpreted for patient  #Refugee/limited [facility]  -SW consulted  Plan for discharge tomorrow given time of delivery.   LOS: 1 day   Alric Seton 07/29/2020, 12:40 PM

## 2020-07-29 NOTE — Progress Notes (Signed)
I have reviewed and concur with students documentation.  

## 2020-07-29 NOTE — Lactation Note (Signed)
This note was copied from a baby's chart. Lactation Consultation Note Baby is 6 hrs old. Used AMN interpreter Byrd Hesselbach [phone] for consult. Dari. Mom is in a lot of pain, cramping and pain in her arm from her IV. Mom has iron being infused. RN notified. Mom had baby on the breast off and on laying in part side lying position. Mom moaning in pain. Mom wasn't in a good position. LC encouraged mom to side up in bed. LC used pillows for props for support. Attempted to latch baby. Baby just held nipple in mouth. Mom stated baby has eaten 30 min. Mom tired wanted baby in bassinet. LC wrapped baby placed in bassinet.  Reviewed newborn behavior, STS. Asked mom if she has WIC mom stated no she didn't know what that was.  Mom really not paying to much attention to [facility] d/t pain. Lactation brochure given. FOB sleeping.  Mom has 5 and 3 yr old that she BF for 2 yr. Each. Encouraged mom to call for assistance in feeding or latching.   Patient Name: Jean Myers [id number]'G Date: 07/29/2020 Reason for consult: Initial assessment;Term Age:26 hours  Maternal Data    Feeding    LATCH Score Latch: Too sleepy or reluctant, no latch achieved, no sucking elicited.  Audible Swallowing: None  Type of Nipple: Everted at rest and after stimulation  Comfort (Breast/Nipple): Soft / non-tender  Hold (Positioning): Assistance needed to correctly position infant at breast and maintain latch.  LATCH Score: 5   Lactation Tools Discussed/Used    Interventions Interventions: Breast feeding basics reviewed;Support pillows;Assisted with latch;Position options;Skin to skin;Adjust position  Discharge [facility] Program: No  Consult Status Consult Status: Follow-up Date: 07/29/20 Follow-up type: In-patient    [name] 07/29/2020, 5:14 AM

## 2020-07-29 NOTE — Progress Notes (Signed)
Bedside RN expressing concern that patient is unable to tolerate IV iron infusion.  IV flushes well, patient tolerates flush, but complains of pain any time Iron is restarted.  Pharmacy suggests Benadryl, Dr. Barb Merino notified of suggestion, requests RN to see if patient willing to try Benadryl & restarting iron infusion.  If not, instructed to call back for PO iron order.  Bedside RN to discuss with patient.  Dr. Barb Merino also notified of patient's inability to void, RN is I&O cathing patient for second time.

## 2020-07-30 MED ORDER — IBUPROFEN 600 MG PO TABS: 600.0000 mg | ORAL_TABLET | Freq: Four times a day (QID) | ORAL | 0 refills | Status: DC

## 2020-07-30 MED ORDER — VITAMIN B-6 50 MG PO TABS: 50.0000 mg | ORAL_TABLET | Freq: Every day | ORAL | 1 refills | Status: DC

## 2020-07-30 NOTE — Anesthesia Postprocedure Evaluation (Signed)
Anesthesia Post Note  Patient: Jean Myers  Procedure(s) Performed: AN AD HOC LABOR EPIDURAL     Patient location during evaluation: Mother Baby Anesthesia Type: Epidural Level of consciousness: awake and alert Pain management: pain level controlled Vital Signs Assessment: post-procedure vital signs reviewed and stable Respiratory status: spontaneous breathing Cardiovascular status: stable Postop Assessment: no headache, adequate PO intake, no backache, patient able to bend at knees, able to ambulate, epidural receding and no apparent nausea or vomiting Anesthetic complications: no   No complications documented.  Last Vitals:  Vitals:   07/29/20 2138 07/30/20 0541  BP: (!) 97/55 (!) 97/52  Pulse: 63 73  Resp: 15 18  Temp: 36.7 C 36.6 C  SpO2: 98% 99%    Last Pain:  Vitals:   07/30/20 0725  TempSrc:   PainSc: 0-No pain   Pain Goal: Patients Stated Pain Goal: 0 (07/29/20 0249)                 Salome Arnt

## 2020-07-30 NOTE — Discharge Instructions (Signed)

## 2020-07-30 NOTE — Lactation Note (Signed)
This note was copied from a baby's chart. Lactation Consultation Note  Patient Name: Jean Myers [id number] Date: 07/30/2020 Reason for consult: Follow-up assessment Age:25 hours   P3 mother whose infant is now 78 hours old.  This is a term baby at 40+5 weeks.  Mother has breast feeding experience with her other two children.  Father requests to be the interpreter for mother's language.  NP in room when I arrived preparing for discharge.  Mother had a few questions which I answered to her satisfaction.  She will continue to feed on cue and at least every three to four hours.  Suggested mother continue to feed STS until mother/baby are feeding well together.  Discussed obtaining a deep latch.   Engorgement prevention/treatment reviewed.  Mother has a manual pump for home use.  Family will have transportation between 1200-1230 and will call when their ride arrives.    Maternal Data    Feeding    LATCH Score Latch: Grasps breast easily, tongue down, lips flanged, rhythmical sucking.  Audible Swallowing: A few with stimulation  Type of Nipple: Everted at rest and after stimulation  Comfort (Breast/Nipple): Soft / non-tender  Hold (Positioning): No assistance needed to correctly position infant at breast.  LATCH Score: 9   Lactation Tools Discussed/Used    Interventions Interventions: Education  Discharge Discharge Education: Engorgement and breast care Pump: Manual  Consult Status Consult Status: Complete Date: 07/30/20 Follow-up type: Call as needed    Neeka Urista R Rafaella Kole 07/30/2020, 11:41 AM

## 2020-07-30 NOTE — Lactation Note (Signed)
This note was copied from a baby's chart. Lactation Consultation Note Mom pointed to bassinet for LC to put sleeping baby in bassinet. LC did so. FOB sleeping soundly. Asked mom if she is OK mom said yes. With FOB sleeping now isn't the time to go over d/c teaching. Experienced BF mom doing well.  Patient Name: Jean Myers [id number] Date: 07/30/2020   Age:25 hours  Maternal Data    Feeding    LATCH Score                    Lactation Tools Discussed/Used    Interventions    Discharge    Consult Status      [name] 07/30/2020, 5:59 AM

## 2020-07-31 LAB — SURGICAL PATHOLOGY

## 2020-08-07 ENCOUNTER — Telehealth: Payer: Self-pay | Admitting: Lactation Services

## 2020-08-07 NOTE — Telephone Encounter (Signed)
Theodoro Clock RN called and LM that she would like a call back in regards to patients most recent labs. She will be going to patients house today and does not want to repeat labs that has been done previously.   Reviewed last CBC was obtained on 4/5. Last CBC faxed to Darl Pikes, [facility].

## 2020-08-12 ENCOUNTER — Telehealth: Payer: Self-pay | Admitting: Lactation Services

## 2020-08-12 NOTE — Telephone Encounter (Signed)
Received call from [facility] nurse Jacquelin Hawking.   She reports she has been out to see patient. Patient is c/o headaches, blurred vision and dizziness upon standing. Her BP today is 96/74. She is staying hydrated and taking her Fe.   Patient is also noting burning with urination and low back pain.   Scheduled nurse visit for Thursday at 10:20 am.   Called patient with assistance of [employer], #[id number].   Patient reports headache when she wakes up or stands up. She is having blurred vision or dizziness with standing. She has not tried Tylenol. She is drinking water and feels the headache improves off and on during the day.   Patient reports she is having burning with urination. This is happening every time she is going to the bathroom.   Advised patient that we would like to bring her into the office to check her BP and urine. Patient given nurse visit appointment time of 4/21 at 10:20 am.   Advised patient to keep eating and drinking as she can and to stand up very slowly.   Advised patient I feeling worse to call us back. Advised if starts feeling worse she can go to [facility] for evaluation. Patient voiced understanding.

## 2020-08-14 ENCOUNTER — Ambulatory Visit: Payer: No Typology Code available for payment source

## 2020-08-25 ENCOUNTER — Ambulatory Visit: Payer: No Typology Code available for payment source | Admitting: Obstetrics and Gynecology

## 2020-09-01 ENCOUNTER — Other Ambulatory Visit: Payer: Self-pay

## 2020-09-01 ENCOUNTER — Ambulatory Visit (INDEPENDENT_AMBULATORY_CARE_PROVIDER_SITE_OTHER): Payer: Medicaid Other | Admitting: Nurse Practitioner

## 2020-09-01 ENCOUNTER — Other Ambulatory Visit (HOSPITAL_COMMUNITY)
Admission: RE | Admit: 2020-09-01 | Discharge: 2020-09-01 | Disposition: A | Discharge status: Home / Self Care | Payer: Medicaid Other | Source: Ambulatory Visit | Attending: Student | Admitting: Student

## 2020-09-01 ENCOUNTER — Encounter: Payer: Self-pay | Admitting: Obstetrics and Gynecology

## 2020-09-01 VITALS — BP 96/56 | HR 65 | Wt 162.6 lb

## 2020-09-01 DIAGNOSIS — Z5941 Food insecurity: Secondary | ICD-10-CM

## 2020-09-01 LAB — POCT URINALYSIS DIP (DEVICE)
Bilirubin Urine: NEGATIVE
Glucose, UA: NEGATIVE mg/dL
Hgb urine dipstick: NEGATIVE
Ketones, ur: NEGATIVE mg/dL
Nitrite: NEGATIVE
Protein, ur: NEGATIVE mg/dL
Specific Gravity, Urine: 1.02 (ref 1.005–1.030)
Urobilinogen, UA: 0.2 mg/dL (ref 0.0–1.0)
pH: 5 (ref 5.0–8.0)

## 2020-09-01 NOTE — Progress Notes (Signed)
Post Partum Visit Note  Jean Myers is a 25 y.o. [id number] female who presents for a postpartum visit. She is 5 weeks postpartum following a vacuum-assisted vaginal delivery.  I have fully reviewed the prenatal and intrapartum course. The delivery was at 40.5 gestational weeks. She was admitted for induction of labor due to postdates.  She had a VBAC and vacuum assisted birth with a 2nd degree laceration.  Anesthesia: epidural. Postpartum course has been good - has some burning with urination. Baby is doing well. Baby is feeding by breast. Bleeding staining only. Bowel function is normal. Bladder function is normal. Patient is not sexually active. States she is having some back pain at the site of the epidural.  Contraception method is Depo-Provera injections. Postpartum depression screening: negative. Patient has had an in person and a video interpreter for her visit today.  The pregnancy intention screening data noted above was reviewed. Potential methods of contraception were discussed. The patient elected to proceed with Hormonal Injection.    Edinburgh Postnatal Depression Scale - 09/01/20 0903      Edinburgh Postnatal Depression Scale:  In the Past 7 Days   I have been able to laugh and see the funny side of things. 0    I have looked forward with enjoyment to things. 0    I have blamed myself unnecessarily when things went wrong. 0    I have been anxious or worried for no good reason. 0    I have felt scared or panicky for no good reason. 0    Things have been getting on top of me. 0    I have been so unhappy that I have had difficulty sleeping. 0    I have felt sad or miserable. 0    I have been so unhappy that I have been crying. 0    The thought of harming myself has occurred to me. 0    Edinburgh Postnatal Depression Scale Total 0           Health Maintenance Due  Topic Date Due  . HPV VACCINES (1 - 2-dose series) Never done  . PAP-Cervical Cytology Screening  Never done   . PAP SMEAR-Modifier  Never done    The following portions of the patient's history were reviewed and updated as appropriate: allergies, current medications, past family history, past medical history, past social history, past surgical history and problem list.  Review of Systems Pertinent items noted in HPI and remainder of comprehensive ROS otherwise negative.  Objective:  BP (!) 96/56   Pulse 65   Wt 162 lb 9.6 oz (73.8 kg)   LMP  (LMP Unknown)   BMI 28.81 kg/m    General:  alert, cooperative and no distress   Breasts:  not indicated  Lungs: clear to auscultation bilaterally  Heart:  regular rate and rhythm, S1, S2 normal, no murmur, click, rub or gallop  Abdomen: soft, nontender   Wound  NA  GU exam:  Normal and 2 stitches still present - have not yet dissolved.  No infection.  No granulomatous tissue seen.       Assessment:     Normal postpartum exam.   Plan:   Essential components of care per ACOG recommendations:  1.  Mood and well being: Patient with negative depression screening today. Reviewed local resources for support.  - Patient tobacco use? No.   - hx of drug use? No.    2. Infant care and feeding:  -Patient  currently breastmilk feeding?  -Social determinants of health (SDOH) reviewed in EPIC.  Food insecurity - to visit food market today in clinic.  3. Sexuality, contraception and birth spacing - Patient does not want a pregnancy in the next year.   - Reviewed forms of contraception in tiered fashion. Patient desired Depo-Provera today.   - Discussed birth spacing of 18 months  4. Sleep and fatigue -Encouraged family/partner/community support of 4 hrs of uninterrupted sleep to help with mood and fatigue  5. Physical Recovery  - Discussed patients delivery and complications. She describes her labor as good. - Patient had a Induction and Vaginal birth (VBAC) with vacuum assistance at birth. Patient had a 2nd degree laceration. Perineal healing  reviewed. Patient expressed understanding.  Advised to wait at least 2 more weeks and when not having urinary burning (due to stitches not yet dissolved) will be able to resume sexual activity - Patient has urinary incontinence? No. - Patient is not safe to resume physical and sexual activity  6.  Health Maintenance - HM due items addressed No - language barrier - Pap smear done at today's visit.  -Breast Cancer screening indicated? No.   7. Chronic Disease/Pregnancy Condition follow up: None Continue TB treatment - observed therapy done by the health department. - PCP follow up  Currie Paris, NP [facility] [employer], [facility]

## 2020-09-01 NOTE — Addendum Note (Signed)
Addended by: Guy Begin on: 09/01/2020 01:52 PM   Modules accepted: Orders

## 2020-09-03 LAB — URINE CULTURE: Organism ID, Bacteria: NO GROWTH

## 2020-09-03 LAB — CYTOLOGY - PAP: Diagnosis: NEGATIVE

## 2020-09-25 ENCOUNTER — Other Ambulatory Visit: Payer: Self-pay | Admitting: Obstetrics and Gynecology

## 2020-09-25 ENCOUNTER — Ambulatory Visit
Admission: RE | Admit: 2020-09-25 | Discharge: 2020-09-25 | Disposition: A | Discharge status: Home / Self Care | Payer: No Typology Code available for payment source | Source: Ambulatory Visit | Attending: Obstetrics and Gynecology | Admitting: Obstetrics and Gynecology

## 2020-09-25 DIAGNOSIS — A159 Respiratory tuberculosis unspecified: Secondary | ICD-10-CM

## 2020-11-20 ENCOUNTER — Ambulatory Visit (INDEPENDENT_AMBULATORY_CARE_PROVIDER_SITE_OTHER): Payer: Medicaid Other | Admitting: General Practice

## 2020-11-20 ENCOUNTER — Other Ambulatory Visit: Payer: Self-pay

## 2020-11-20 DIAGNOSIS — Z3009 Encounter for other general counseling and advice on contraception: Secondary | ICD-10-CM

## 2020-11-20 NOTE — Progress Notes (Signed)
Patient presents to office today for depo provera injection. Patient desires to change birth control to Nexplanon instead. Patient feels initially depo injection may have caused issues with her nerves, fatigue & headaches. Reviewed with patient those are not expected side effects and were likely due to delivery. Discussed Nexplanon could not be inserted today and she will need to return for provider visit. Condoms given today. Also recommended patient find PCP office as she reports continued headaches & back pain. Information for [facility] provided.  Chase Caller RN BSN 11/20/20

## 2020-11-20 NOTE — Progress Notes (Signed)
Patient seen and assessed by nursing staff.  Agree with documentation and plan.  

## 2020-12-25 ENCOUNTER — Ambulatory Visit: Payer: No Typology Code available for payment source | Admitting: Obstetrics & Gynecology

## 2021-10-01 ENCOUNTER — Ambulatory Visit: Payer: No Typology Code available for payment source | Admitting: Student

## 2021-10-21 ENCOUNTER — Other Ambulatory Visit (HOSPITAL_COMMUNITY)
Admission: RE | Admit: 2021-10-21 | Discharge: 2021-10-21 | Disposition: A | Discharge status: Home / Self Care | Payer: Medicaid Other | Source: Ambulatory Visit | Attending: Certified Nurse Midwife | Admitting: Certified Nurse Midwife

## 2021-10-21 ENCOUNTER — Other Ambulatory Visit: Payer: Self-pay

## 2021-10-21 ENCOUNTER — Ambulatory Visit (INDEPENDENT_AMBULATORY_CARE_PROVIDER_SITE_OTHER): Payer: Medicaid Other | Admitting: Certified Nurse Midwife

## 2021-10-21 ENCOUNTER — Encounter: Payer: Self-pay | Admitting: Certified Nurse Midwife

## 2021-10-21 VITALS — BP 115/49 | HR 73 | Wt 162.0 lb

## 2021-10-21 DIAGNOSIS — Z113 Encounter for screening for infections with a predominantly sexual mode of transmission: Secondary | ICD-10-CM | POA: Diagnosis present

## 2021-10-21 DIAGNOSIS — R102 Pelvic and perineal pain: Secondary | ICD-10-CM | POA: Diagnosis not present

## 2021-10-21 DIAGNOSIS — Z3009 Encounter for other general counseling and advice on contraception: Secondary | ICD-10-CM | POA: Diagnosis not present

## 2021-10-21 DIAGNOSIS — Z01419 Encounter for gynecological examination (general) (routine) without abnormal findings: Secondary | ICD-10-CM

## 2021-10-21 DIAGNOSIS — R5383 Other fatigue: Secondary | ICD-10-CM

## 2021-10-22 ENCOUNTER — Telehealth: Payer: Self-pay

## 2021-10-22 LAB — HEPATITIS C ANTIBODY: Hep C Virus Ab: NONREACTIVE

## 2021-10-22 LAB — CERVICOVAGINAL ANCILLARY ONLY
Chlamydia: NEGATIVE
Comment: NEGATIVE
Comment: NEGATIVE
Comment: NORMAL
Neisseria Gonorrhea: NEGATIVE
Trichomonas: NEGATIVE

## 2021-10-22 LAB — THYROID PANEL WITH TSH
Free Thyroxine Index: 1.9 (ref 1.2–4.9)
T3 Uptake Ratio: 25 % (ref 24–39)
T4, Total: 7.7 ug/dL (ref 4.5–12.0)
TSH: 1.68 u[IU]/mL (ref 0.450–4.500)

## 2021-10-22 LAB — CBC
Hematocrit: 38.1 % (ref 34.0–46.6)
Hemoglobin: 13 g/dL (ref 11.1–15.9)
MCH: 28.9 pg (ref 26.6–33.0)
MCHC: 34.1 g/dL (ref 31.5–35.7)
MCV: 85 fL (ref 79–97)
Platelets: 350 10*3/uL (ref 150–450)
RBC: 4.5 x10E6/uL (ref 3.77–5.28)
RDW: 12.6 % (ref 11.7–15.4)
WBC: 7.3 10*3/uL (ref 3.4–10.8)

## 2021-10-22 LAB — HIV ANTIBODY (ROUTINE TESTING W REFLEX): HIV Screen 4th Generation wRfx: NONREACTIVE

## 2021-10-22 LAB — RPR: RPR Ser Ql: NONREACTIVE

## 2021-10-22 LAB — HEPATITIS B SURFACE ANTIGEN: Hepatitis B Surface Ag: NEGATIVE

## 2021-10-22 LAB — HEMOGLOBIN A1C
Est. average glucose Bld gHb Est-mCnc: 111 mg/dL
Hgb A1c MFr Bld: 5.5 % (ref 4.8–5.6)

## 2021-10-22 NOTE — Telephone Encounter (Signed)
Encounter opened in error

## 2021-10-24 NOTE — Progress Notes (Signed)
ANNUAL EXAM Patient name: Jean Myers MRN [id number]  Date of birth: 1995/06/07 Chief Complaint:   Vaginal Pain and Annual Exam  History of Present Illness:   Jean Myers is a 26 y.o. [phone]  [place] (Dari speaking)  female being seen today for a routine annual exam.  Current complaints: having some incisional pain and pain with menses since last Cesarean, also notes she his fatigued and has an increased appetite. Still breastfeeding her daughter exclusively. Was interested in Nexplanon but wants to wait until some of her symptoms resolve.  Patient's last menstrual period was 10/11/2021 (exact date).  Last pap 09/01/21. Results were: NILM w/ HRHPV negative. H/O abnormal pap: no Last mammogram: N/A (age). Results were: N/A. Family h/o breast cancer: no Last colonoscopy: N/A (age). Results were: N/A. Family h/o colorectal cancer: no     10/21/2021    4:12 PM 07/10/2020    8:16 AM 06/26/2020    2:15 PM 06/18/2020   12:16 PM  Depression screen PHQ 2/9  Decreased Interest 0 0 0 0  Down, Depressed, Hopeless 0 0 0 0  PHQ - 2 Score 0 0 0 0  Altered sleeping 0 1 2 2   Tired, decreased energy 0 0 1 2  Change in appetite 0 0 2 2  Feeling bad or failure about yourself  0 0 0 0  Trouble concentrating 0 1 1 0  Moving slowly or fidgety/restless 0 0 0 0  Suicidal thoughts 0 0 0 0  PHQ-9 Score 0 2 6 6         10/21/2021    4:12 PM 07/10/2020    8:16 AM  GAD 7 : Generalized Anxiety Score  Nervous, Anxious, on Edge 0 1  Control/stop worrying 0 0  Worry too much - different things 0 1  Trouble relaxing 0 1  Restless 0 0  Easily annoyed or irritable 0 0  Afraid - awful might happen 0 0  Total GAD 7 Score 0 3     Review of Systems:   Pertinent items are noted in HPI Denies any headaches, blurred vision, fatigue, shortness of breath, chest pain, abdominal pain, abnormal vaginal discharge/itching/odor/irritation, problems with periods, bowel movements, urination, or intercourse unless  otherwise stated above. Pertinent History Reviewed:  Reviewed past medical,surgical, social and family history.  Reviewed problem list, medications and allergies. Physical Assessment:   Vitals:   10/21/21 1549  BP: (!) 115/49  Pulse: 73  Weight: 162 lb (73.5 kg)   Body mass index is 28.7 kg/m.   Physical Examination:  General appearance - well appearing, and in no distress Mental status - alert, oriented to person, place, and time Psych:  She has a normal mood and affect Skin - warm and dry, normal color, no suspicious lesions noted Chest - effort normal, no problems with respiration noted Heart - normal rate and regular rhythm Neck:  midline trachea, no thyromegaly or nodules Breasts - breasts appear normal Abdomen - soft, nontender, nondistended, no masses or organomegaly - Incisional site has some tenderness Pelvic - exam deferred Extremities:  No swelling or varicosities noted  Chaperone present for exam  No results found for this or any previous visit (from the past 24 hour(s)).  Assessment & Plan:  1. Women's annual routine gynecological examination - Will follow up results of pap smear and manage accordingly. - Routine preventative health maintenance measures emphasized.  2. Screening examination for STD (sexually transmitted disease) - Cervicovaginal ancillary only( [place]) - RPR - HIV antibody (  with reflex) - Hepatitis B Surface AntiGEN - Hepatitis C Antibody  3. Birth control counseling - Desires to wait until she feels better  4. Pelvic pain - Demonstrated post-Cesarean massage to help recovery, encouraged daily massage with olive oil or other lotion - US PELVIS (TRANSABDOMINAL ONLY); Future  5. Fatigue, unspecified type - Encouraged regular eating and rest given that she is still breastfeeding - CBC - Hemoglobin A1c - Thyroid Panel With TSH  Mammogram: @ 26yo, or sooner if problems Colonoscopy: @ 26yo, or sooner if problems  Orders Placed  This Encounter  Procedures   US PELVIS (TRANSABDOMINAL ONLY)   RPR   HIV antibody (with reflex)   Hepatitis B Surface AntiGEN   Hepatitis C Antibody   CBC   Hemoglobin A1c   Thyroid Panel With TSH   Meds: No orders of the defined types were placed in this encounter.   Follow-up: Return if symptoms worsen or fail to improve.  Edd Arbour, CNM, MSN, IBCLC Certified Nurse Midwife, [facility]

## 2021-11-02 ENCOUNTER — Ambulatory Visit
Admission: RE | Admit: 2021-11-02 | Discharge: 2021-11-02 | Disposition: A | Discharge status: Home / Self Care | Payer: Medicaid Other | Source: Ambulatory Visit | Attending: Certified Nurse Midwife | Admitting: Certified Nurse Midwife

## 2021-11-02 DIAGNOSIS — R102 Pelvic and perineal pain: Secondary | ICD-10-CM | POA: Diagnosis present

## 2021-11-11 ENCOUNTER — Other Ambulatory Visit: Payer: Self-pay

## 2021-11-11 DIAGNOSIS — R7309 Other abnormal glucose: Secondary | ICD-10-CM

## 2021-11-11 LAB — GLUCOSE, POCT (MANUAL RESULT ENTRY): POC Glucose: 104 mg/dl — AB (ref 70–99)

## 2021-11-11 NOTE — Congregational Nurse Program (Signed)
  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  obtaining treatment through [facility]--they come to her home every morning for observed treatment.    Encounter Details:  CNP Questionnaire - 11/11/21 1213       Questionnaire   Do you give verbal consent to treat you today? Yes    Location Patient Served  [facility]    Visit Setting Church or Organization    Patient Status Refugee    Insurance [facility]    Insurance Referral N/A    Medication N/A    Medical Provider Yes    Screening Referrals N/A    Medical Referral N/A    Medical Appointment Made N/A    Food N/A    Transportation N/A    Housing/Utilities N/A    Interpersonal Safety N/A    Intervention Blood pressure;Blood glucose;Advocate;Navigate Healthcare System;Case Management;Counsel;Educate;Support    ED Visit Averted Yes    Life-Saving Intervention Made N/A            Patient came in asking for blood glucose check and blood pressure check. Complaining about dizziness and would like to make doctor's appointment herself. Telephone number and address provided.   Nicole Cella Camay Pedigo RN BSN PCCN  [facility] [phone]-cell [phone]

## 2022-02-10 ENCOUNTER — Ambulatory Visit: Payer: No Typology Code available for payment source

## 2022-02-10 DIAGNOSIS — Z23 Encounter for immunization: Secondary | ICD-10-CM

## 2022-03-19 ENCOUNTER — Ambulatory Visit (HOSPITAL_COMMUNITY)
Admission: EM | Admit: 2022-03-19 | Discharge: 2022-03-19 | Disposition: A | Discharge status: Home / Self Care | Payer: Medicaid Other | Attending: Family Medicine | Admitting: Family Medicine

## 2022-03-19 ENCOUNTER — Encounter (HOSPITAL_COMMUNITY): Payer: Self-pay

## 2022-03-19 DIAGNOSIS — J069 Acute upper respiratory infection, unspecified: Secondary | ICD-10-CM | POA: Diagnosis not present

## 2022-03-19 DIAGNOSIS — U071 COVID-19: Secondary | ICD-10-CM | POA: Insufficient documentation

## 2022-03-19 LAB — RESP PANEL BY RT-PCR (RSV, FLU A&B, COVID)  RVPGX2
Influenza A by PCR: NEGATIVE
Influenza B by PCR: NEGATIVE
Resp Syncytial Virus by PCR: NEGATIVE
SARS Coronavirus 2 by RT PCR: POSITIVE — AB

## 2022-03-19 MED ORDER — IBUPROFEN 400 MG PO TABS
400.0000 mg | ORAL_TABLET | Freq: Four times a day (QID) | ORAL | 0 refills | Status: DC | PRN
Start: 2022-03-19 — End: 2022-12-15

## 2022-03-19 NOTE — ED Triage Notes (Signed)
27 yr old daughter was sick first. Entire family having fever, cough, and runny noses.  Patient also having headaches.

## 2022-03-19 NOTE — Discharge Instructions (Signed)
You have been swabbed for COVID/flu/RSV, and the test will result in the next 24 hours. Our staff will call you if positive. If the COVID test is positive, you should quarantine for 5 days from the start of your symptoms   Ibuprofen 400 mg--1 every 6 hours as needed for pain or fever

## 2022-03-19 NOTE — ED Provider Notes (Signed)
[facility]    CSN: [id number] Arrival date & time: 03/19/22  1011      History   Chief Complaint Chief Complaint  Patient presents with   URI   Fever    HPI Jean Myers is a 26 y.o. female.    URI Presenting symptoms: fever   Fever  Here for cough, congestion, and low grade subjective fever.  She is also had headache.  She was exposed to her 9-year-old daughter who got sick first and the family, and now everyone else in the family has similar symptoms.  Her symptoms began on November 22.  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  obtaining treatment through [facility]--they come to her home every morning for observed treatment.    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 Authorizing Provider  ibuprofen (ADVIL) 400 MG tablet Take 1 tablet (400 mg total) by mouth every 6 (six) hours as needed (pain or fever). 03/19/22  Yes Zenia Resides, MD    Family History History reviewed. No pertinent family history.  Social History Social History   Tobacco Use   Smoking status: Never   Smokeless tobacco: Never  Vaping Use   Vaping Use: Never used  Substance Use Topics   Alcohol use: Never   Drug use: Never     Allergies   Patient has no known  allergies.   Review of Systems Review of Systems  Constitutional:  Positive for fever.     Physical Exam Triage Vital Signs ED Triage Vitals  Enc Vitals Group     BP 03/19/22 1149 101/67     Pulse Rate 03/19/22 1149 77     Resp 03/19/22 1149 16     Temp 03/19/22 1149 97.9 F (36.6 C)     Temp Source 03/19/22 1149 Oral     SpO2 03/19/22 1149 96 %     Weight --      Height --      Head Circumference --      Peak Flow --      Pain Score 03/19/22 1148 0     Pain Loc --      Pain Edu? --      Excl. in GC? --    No data found.  Updated Vital Signs BP 101/67 (BP Location: Left Arm)   Pulse 77   Temp 97.9 F (36.6 C) (Oral)   Resp 16   LMP 03/05/2022 (Approximate)   SpO2 96%  Breastfeeding No   Visual Acuity Right Eye Distance:   Left Eye Distance:   Bilateral Distance:    Right Eye Near:   Left Eye Near:    Bilateral Near:     Physical Exam Vitals reviewed.  Constitutional:      General: She is not in acute distress.    Appearance: She is not toxic-appearing.  HENT:     Right Ear: Tympanic membrane and ear canal normal.     Left Ear: Tympanic membrane and ear canal normal.     Nose: Nose normal.     Mouth/Throat:     Mouth: Mucous membranes are moist.     Comments: Oropharynx is benign with no erythema.  Clear mucus is draining in the oropharynx Eyes:     Extraocular Movements: Extraocular movements intact.     Conjunctiva/sclera: Conjunctivae normal.     Pupils: Pupils are equal, round, and reactive to light.  Cardiovascular:     Rate and Rhythm: Normal rate and regular rhythm.     Heart sounds: No murmur heard. Pulmonary:     Effort: Pulmonary effort is normal. No respiratory distress.     Breath sounds: No stridor. No wheezing, rhonchi or rales.  Musculoskeletal:     Cervical back: Neck supple.  Lymphadenopathy:     Cervical: No cervical adenopathy.  Skin:    Capillary Refill: Capillary refill takes less than 2 seconds.     Coloration: Skin  is not jaundiced or pale.  Neurological:     General: No focal deficit present.     Mental Status: She is alert and oriented to person, place, and time.  Psychiatric:        Behavior: Behavior normal.      UC Treatments / Results  Labs (all labs ordered are listed, but only abnormal results are displayed) Labs Reviewed  RESP PANEL BY RT-PCR (RSV, FLU A&B, COVID)  [id number]    EKG   Radiology No results found.  Procedures Procedures (including critical care time)  Medications Ordered in UC Medications - No data to display  Initial Impression / Assessment and Plan / UC Course  I have reviewed the triage vital signs and the nursing notes.  Pertinent labs & imaging results that were available during my care of the patient were reviewed by me and considered in my medical decision making (see chart for details).        Swab was done for COVID, RSV, and flu, So she knows if she needs to quarantine.  If she is positive for flu she is a candidate for Tamiflu.  Ibuprofen sent for the fever and pain Final Clinical Impressions(s) / UC Diagnoses   Final diagnoses:  Viral URI with cough     Discharge Instructions      You have been swabbed for COVID/flu/RSV, and the test will result in the next 24 hours. Our staff will call you if positive. If the COVID test is positive, you should quarantine for 5 days from the start of your symptoms   Ibuprofen 400 mg--1 every 6 hours as needed for pain or fever     ED Prescriptions     Medication Sig Dispense Auth. Provider   ibuprofen (ADVIL) 400 MG tablet Take 1 tablet (400 mg total) by mouth every 6 (six) hours as needed (pain or fever). 30 tablet Twania Bujak, Janace Aris, MD      PDMP not reviewed this encounter.   Zenia Resides, MD 03/19/22 1236

## 2022-11-17 ENCOUNTER — Encounter (HOSPITAL_COMMUNITY): Payer: Self-pay | Admitting: Emergency Medicine

## 2022-11-17 ENCOUNTER — Ambulatory Visit (HOSPITAL_COMMUNITY)
Admission: EM | Admit: 2022-11-17 | Discharge: 2022-11-17 | Disposition: A | Discharge status: Home / Self Care | Payer: Medicaid Other | Attending: Family Medicine | Admitting: Family Medicine

## 2022-11-17 DIAGNOSIS — H65191 Other acute nonsuppurative otitis media, right ear: Secondary | ICD-10-CM | POA: Diagnosis not present

## 2022-11-17 DIAGNOSIS — H6991 Unspecified Eustachian tube disorder, right ear: Secondary | ICD-10-CM

## 2022-11-17 MED ORDER — PREDNISONE 10 MG PO TABS: 10.0000 mg | ORAL_TABLET | Freq: Every day | ORAL | 0 refills | Status: AC

## 2022-11-17 MED ORDER — FLUTICASONE PROPIONATE 50 MCG/ACT NA SUSP: 2.0000 | Freq: Two times a day (BID) | NASAL | 0 refills | Status: DC | PRN

## 2022-11-17 MED ORDER — CETIRIZINE HCL 10 MG PO TABS: 10.0000 mg | ORAL_TABLET | Freq: Every day | ORAL | 0 refills | Status: DC

## 2022-11-17 NOTE — ED Provider Notes (Signed)
[facility]    CSN: [id number] Arrival date & time: 11/17/22  1750      History   Chief Complaint Chief Complaint  Patient presents with   Ear Pain    HPI Jean Myers is a 27 y.o. female.   HPI Patient presents today concern that a FB is in her right ear from her ear pod. Patient reports feeling fullness in both ears. Denies any overt ear pain or URI symptom. No recent airplane travel or fever. Endorses muffled hearing in the right ear. Denies changes in hearing involving the left ear.  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  obtaining treatment through [facility]--they come to her home every morning for observed treatment.    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 Authorizing Provider  cetirizine (ZYRTEC) 10 MG tablet Take 1 tablet (10 mg total) by mouth at bedtime. 11/17/22  Yes Bing Neighbors, NP  fluticasone (FLONASE) 50 MCG/ACT nasal spray Place 2 sprays into both nostrils 2 (two) times daily as needed for allergies or rhinitis. 11/17/22  Yes Bing Neighbors, NP  predniSONE (DELTASONE) 10 MG tablet Take 1 tablet (10 mg total) by mouth daily with breakfast for 5 days. 11/17/22 11/22/22 Yes Bing Neighbors, NP  ibuprofen (ADVIL) 400 MG tablet Take 1 tablet (400 mg total) by mouth every 6 (six)  hours as needed (pain or fever). 03/19/22   Zenia Resides, MD    Family History No family history on file.  Social History Social History   Tobacco Use   Smoking status: Never   Smokeless tobacco: Never  Vaping Use   Vaping status: Never Used  Substance Use Topics   Alcohol use: Never   Drug use: Never     Allergies   Patient has no known allergies.   Review of Systems Review of Systems Pertinent negatives listed in HPI  Physical Exam Triage Vital Signs ED Triage Vitals  Encounter Vitals Group     BP 11/17/22 1821 106/69     Systolic BP Percentile --      Diastolic BP Percentile --      Pulse Rate 11/17/22 1821 66     Resp 11/17/22 1821 16     Temp 11/17/22 1821 98.1 F (36.7 C)     Temp Source 11/17/22 1821 Oral     SpO2 11/17/22 1821 97 %     Weight --      Height --      Head Circumference --  Peak Flow --      Pain Score 11/17/22 1820 6     Pain Loc --      Pain Education --      Exclude from Growth Chart --    No data found.  Updated Vital Signs BP 106/69 (BP Location: Right Arm)   Pulse 66   Temp 98.1 F (36.7 C) (Oral)   Resp 16   LMP 11/03/2022 (Approximate)   SpO2 97%   Visual Acuity Right Eye Distance:   Left Eye Distance:   Bilateral Distance:    Right Eye Near:   Left Eye Near:    Bilateral Near:     Physical Exam Vitals and nursing note reviewed.  Constitutional:      Appearance: Normal appearance.  HENT:     Head: Normocephalic and atraumatic.     Right Ear: Ear canal and external ear normal. Decreased hearing noted. No tenderness. A middle ear effusion is present.     Left Ear: Tympanic membrane, ear canal and external ear normal. No tenderness.  No middle ear effusion.     Mouth/Throat:     Mouth: Mucous membranes are moist.     Pharynx: Oropharynx is clear.  Eyes:     Extraocular Movements: Extraocular movements intact.     Pupils: Pupils are equal, round, and reactive to light.  Cardiovascular:     Rate and  Rhythm: Normal rate and regular rhythm.  Pulmonary:     Effort: Pulmonary effort is normal.     Breath sounds: Normal breath sounds.  Musculoskeletal:        General: Normal range of motion.     Cervical back: Normal range of motion and neck supple.  Skin:    General: Skin is warm and dry.  Neurological:     General: No focal deficit present.     Mental Status: She is alert.      UC Treatments / Results  Labs (all labs ordered are listed, but only abnormal results are displayed) Labs Reviewed - No data to display  EKG   Radiology No results found.  Procedures Procedures (including critical care time)  Medications Ordered in UC Medications - No data to display  Initial Impression / Assessment and Plan / UC Course  I have reviewed the triage vital signs and the nursing notes.  Pertinent labs & imaging results that were available during my care of the patient were reviewed by me and considered in my medical decision making (see chart for details).    No FB visualized in either ear. MEE notably in right ear.  Patient appears uncomfortable due to ear pressure. Will treat per discharge medication orders and if no improvement follow -up with ENT. Final Clinical Impressions(s) / UC Diagnoses   Final diagnoses:  Acute effusion of right ear  Eustachian tube dysfunction, right   Discharge Instructions   None    ED Prescriptions     Medication Sig Dispense Auth. Provider   predniSONE (DELTASONE) 10 MG tablet Take 1 tablet (10 mg total) by mouth daily with breakfast for 5 days. 5 tablet Bing Neighbors, NP   fluticasone (FLONASE) 50 MCG/ACT nasal spray Place 2 sprays into both nostrils 2 (two) times daily as needed for allergies or rhinitis. 11.1 mL Bing Neighbors, NP   cetirizine (ZYRTEC) 10 MG tablet Take 1 tablet (10 mg total) by mouth at bedtime. 20 tablet Bing Neighbors, NP      PDMP not reviewed this encounter.  Bing Neighbors, NP 11/20/22  [phone]

## 2022-11-17 NOTE — ED Triage Notes (Signed)
Pt had pain in right ear for 2 days. Something possible could be stuck in there, like piece of ear bud.

## 2022-12-15 ENCOUNTER — Encounter (HOSPITAL_COMMUNITY): Payer: Self-pay

## 2022-12-15 ENCOUNTER — Ambulatory Visit (HOSPITAL_COMMUNITY)
Admission: EM | Admit: 2022-12-15 | Discharge: 2022-12-15 | Disposition: A | Discharge status: Home / Self Care | Payer: Medicaid Other | Attending: Emergency Medicine | Admitting: Emergency Medicine

## 2022-12-15 DIAGNOSIS — K0889 Other specified disorders of teeth and supporting structures: Secondary | ICD-10-CM | POA: Diagnosis not present

## 2022-12-15 MED ORDER — IBUPROFEN 800 MG PO TABS: 800.0000 mg | ORAL_TABLET | Freq: Three times a day (TID) | ORAL | 0 refills | Status: DC

## 2022-12-15 MED ORDER — AMOXICILLIN-POT CLAVULANATE 875-125 MG PO TABS: 1.0000 | ORAL_TABLET | Freq: Two times a day (BID) | ORAL | 0 refills | Status: DC

## 2022-12-15 NOTE — Discharge Instructions (Addendum)
????? ?? ???????? ??? ?? ?? ???? ????? ??????? ????. ??? ???? ???? ?? ?????. ???? ?????? ?? ?? ?? ??? ?? ??? ?? ??? ?? ??????? ???? ??? ???? ????. ??? ???????? ??   8 ???? ????? ???? ??? ? ?????? ?????????? ???? ????. ?????? ???? ?? ????????? ???? ??????? ?????.  ???? ????? ???? ?? ???? ?? ?????? ???????.  Please use your mouthwash that you have at home. Do not chew on the affected side. Take antibiotics with food twice daily until finished. You can take ibuprofen every 8 hours for pain and inflammation. Follow-up for a dentist for further evaluation.   Return to clinic for new or urgent symptoms.

## 2022-12-15 NOTE — ED Provider Notes (Signed)
[facility]    CSN: [id number] Arrival date & time: 12/15/22  1252      History   Chief Complaint Chief Complaint  Patient presents with   Dental Pain    HPI Jean Myers is a 27 y.o. female.   Reports left sided lower dental pain for the past week. She reports gum tenderness.  Denies any swelling, fever or abscesses.  Able to eat and drink.  She has not tried any medications at home for this pain.  Reports she does have a dentist that she has seen in the past for 3 cavity removals.     The history is provided by the patient and medical records. The history is limited by a language barrier. A language interpreter was used.  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  obtaining treatment through [facility]--they come to her home every morning for observed treatment.    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 Authorizing Provider  amoxicillin-clavulanate (AUGMENTIN) 875-125 MG tablet Take 1 tablet by mouth every 12 (twelve) hours. 12/15/22  Yes Rinaldo Ratel, [place] N, FNP  ibuprofen (ADVIL) 800 MG tablet Take 1 tablet (800 mg total) by mouth 3 (three) times daily. 12/15/22  Yes Ari Engelbrecht, [place], FNP    Family History History reviewed. No  pertinent family history.  Social History Social History   Tobacco Use   Smoking status: Never   Smokeless tobacco: Never  Vaping Use   Vaping status: Never Used  Substance Use Topics   Alcohol use: Never   Drug use: Never     Allergies   Patient has no known allergies.   Review of Systems Review of Systems  Constitutional:  Negative for fever.  HENT:  Positive for dental problem. Negative for drooling and sore throat.      Physical Exam Triage Vital Signs ED Triage Vitals  Encounter Vitals Group     BP 12/15/22 1333 103/67     Systolic BP Percentile --      Diastolic BP Percentile --      Pulse Rate 12/15/22 1333 (!) 55     Resp 12/15/22 1333 16     Temp 12/15/22 1333 98.3 F (36.8 C)     Temp Source 12/15/22 1333 Oral     SpO2 12/15/22 1333 96 %     Weight 12/15/22 1332 180 lb (81.6 kg)     Height --      Head Circumference --  Peak Flow --      Pain Score 12/15/22 1332 8     Pain Loc --      Pain Education --      Exclude from Growth Chart --    No data found.  Updated Vital Signs BP 103/67 (BP Location: Left Arm)   Pulse (!) 55   Temp 98.3 F (36.8 C) (Oral)   Resp 16   Wt 180 lb (81.6 kg)   LMP 11/03/2022 (Approximate)   SpO2 96%   BMI 31.89 kg/m   Visual Acuity Right Eye Distance:   Left Eye Distance:   Bilateral Distance:    Right Eye Near:   Left Eye Near:    Bilateral Near:     Physical Exam Vitals and nursing note reviewed.  Constitutional:      Appearance: Normal appearance.  HENT:     Head: Normocephalic and atraumatic.     Right Ear: External ear normal.     Left Ear: External ear normal.     Nose: Nose normal.     Mouth/Throat:     Mouth: Mucous membranes are moist.     Dentition: Gingival swelling present. No dental abscesses.     Tongue: No lesions.     Palate: No mass.     Pharynx: Uvula midline.      Comments: Gum inflammation and overgrowth onto left posterior most molar, #17.   Eyes:      Conjunctiva/sclera: Conjunctivae normal.  Cardiovascular:     Rate and Rhythm: Normal rate.  Pulmonary:     Effort: Pulmonary effort is normal. No respiratory distress.  Musculoskeletal:        General: Normal range of motion.  Skin:    General: Skin is warm and dry.  Neurological:     General: No focal deficit present.     Mental Status: She is alert and oriented to person, place, and time.  Psychiatric:        Mood and Affect: Mood normal.        Behavior: Behavior normal.      UC Treatments / Results  Labs (all labs ordered are listed, but only abnormal results are displayed) Labs Reviewed - No data to display  EKG   Radiology No results found.  Procedures Procedures (including critical care time)  Medications Ordered in UC Medications - No data to display  Initial Impression / Assessment and Plan / UC Course  I have reviewed the triage vital signs and the nursing notes.  Pertinent labs & imaging results that were available during my care of the patient were reviewed by me and considered in my medical decision making (see chart for details).  Vitals and triage reviewed, patient is hemodynamically stable.  Gum irritation over tooth #17.  No obvious abscesses or drainage.  No obvious dental caries or trauma.  Advised Magic mouthwash rinse, anti-inflammatories and antibiotics to help ensure no oral infection.  Patient has mouthwash at home.  Advised follow-up with dentistry.  Plan of care, follow-up care and return precautions given, no questions at this time.    Final Clinical Impressions(s) / UC Diagnoses   Final diagnoses:  Pain, dental     Discharge Instructions      ????? ?? ???????? ??? ?? ?? ???? ????? ??????? ????. ??? ???? ???? ?? ?????. ???? ?????? ?? ?? ?? ??? ?? ??? ?? ??? ?? ??????? ???? ??? ???? ????. ??? ???????? ?? 8 ???? ????? ???? ??? ? ?????? ?????????? ???? ????. ?????? ???? ?? ????????? ???? ??????? ?????.  ???? ????? ???? ?? ???? ?? ??????  ???????.  Please use your mouthwash that you have at home. Do not chew on the affected side. Take antibiotics with food twice daily until finished. You can take ibuprofen every 8 hours for pain and inflammation. Follow-up for a dentist for further evaluation.   Return to clinic for new or urgent symptoms.       ED Prescriptions     Medication Sig Dispense Auth. Provider   amoxicillin-clavulanate (AUGMENTIN) 875-125 MG tablet Take 1 tablet by mouth every 12 (twelve) hours. 14 tablet Rinaldo Ratel, [place] N, [place]   ibuprofen (ADVIL) 800 MG tablet Take 1 tablet (800 mg total) by mouth 3 (three) times daily. 21 tablet Anairis Knick, [place], [place]      PDMP not reviewed this encounter.   Culley Hedeen, [place] N, [place] 12/15/22 1409

## 2022-12-15 NOTE — ED Triage Notes (Signed)
Patient here today with c/o left side dental pain X 1 week.

## 2022-12-29 DIAGNOSIS — R42 Dizziness and giddiness: Secondary | ICD-10-CM

## 2022-12-29 LAB — GLUCOSE, POCT (MANUAL RESULT ENTRY): POC Glucose: 120 mg/dL — AB (ref 70–99)

## 2022-12-29 NOTE — Congregational Nurse Program (Signed)
  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  obtaining treatment through [facility]--they come to her home every morning for observed treatment.    Encounter Details:  CNP Questionnaire - 12/29/22 1203       Questionnaire   Ask client: Do you give verbal consent for me to treat you today? Yes    Student Assistance [employer] Nurse    Location Patient Served  [facility]    Visit Setting with Client Organization    Patient Status Refugee    Insurance Medicaid    Insurance/Financial Assistance Referral N/A    Medication N/A    Medical Provider Yes    Screening Referrals Made [facility]    Medical Referrals Made N/A    Medical Appointment Made N/A    Recently w/o PCP, now 1st time PCP visit completed due to CNs referral or appointment made N/A    Food N/A    Transportation N/A    Housing/Utilities N/A    Interpersonal Safety N/A    Interventions Advocate/Support;Navigate Healthcare System;Case Management;Counsel;Educate    Abnormal to Normal Screening Since Last CN Visit N/A    Screenings CN Performed Blood Pressure;Blood Glucose    Sent Client to Lab for: N/A    Did client attend any of the following based off CNs referral or appointments made? N/A    ED Visit Averted Yes    Life-Saving Intervention Made N/A             Patient came in complaining of dizziness. Patient has recent ED visits for various reasons. Advised to reach out to her PCP when she has health questions or isn't feeling good. States she has a PCP but just didn't have any information at the time. She was asked to bring in information about PCP. Blood pressure and blood glucose were checked. Patient was educated about birth control options. She was also advised to take a pregnancy test due to her presenting symptoms. She's not currently using birth control and  doesn't desire pregnancy.    Nicole Cella Caeli Linehan RN BSN PCCN  [facility] [phone]-cell [phone]-office

## 2023-01-10 IMAGING — US US MFM OB COMP +14 WKS
1 series · 13 of 28 positions shown · non-contrast
Comparison: none

[Series 1: us mfm ob comp +14 wks · 95 acquisitions, 13 frames shown]
[im 4/95]
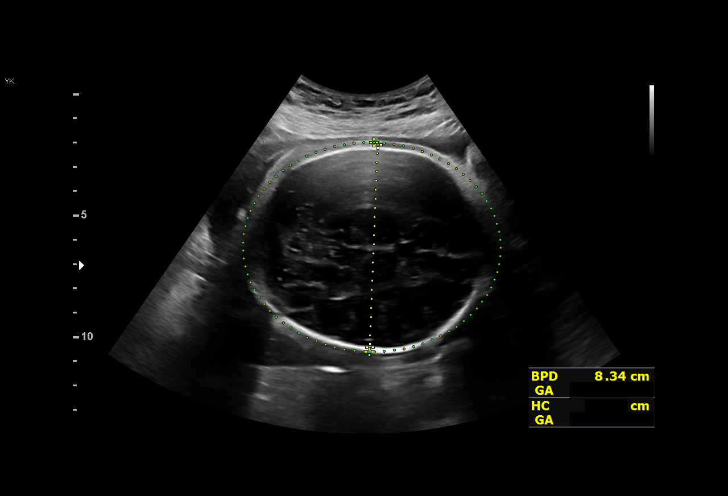
[im 11/95]
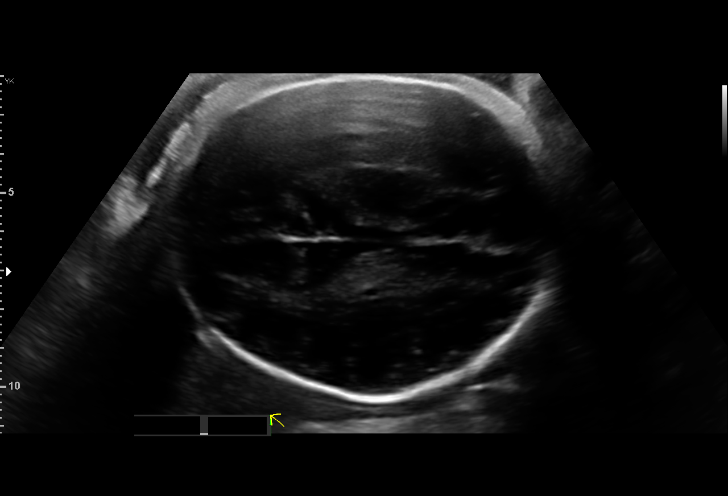
[im 18/95]
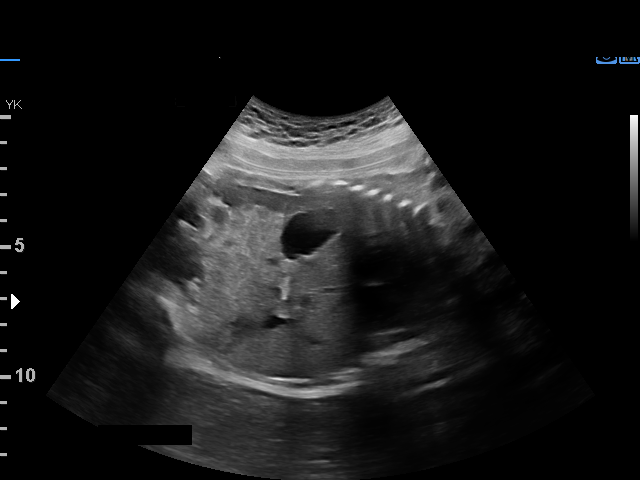
[im 25/95]
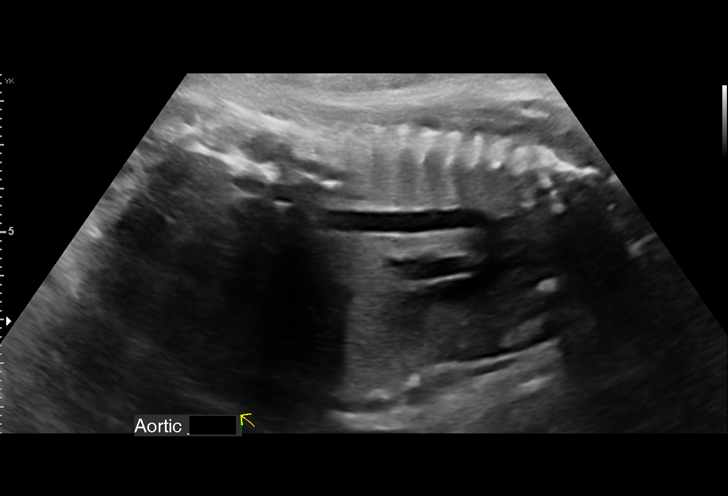
[im 32/95]
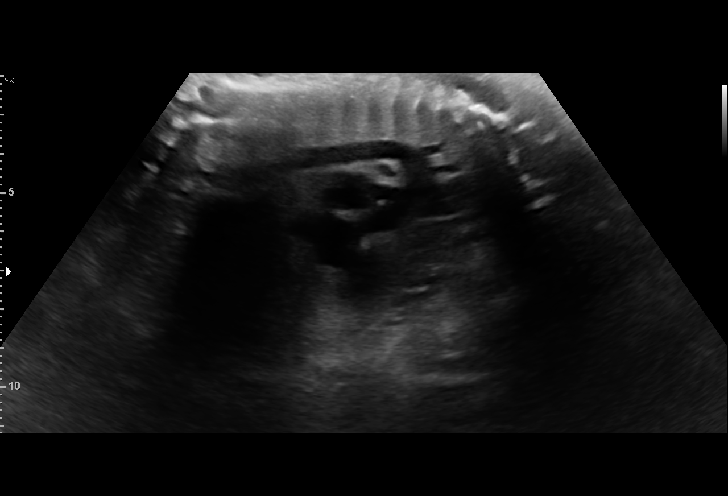
[im 39/95]
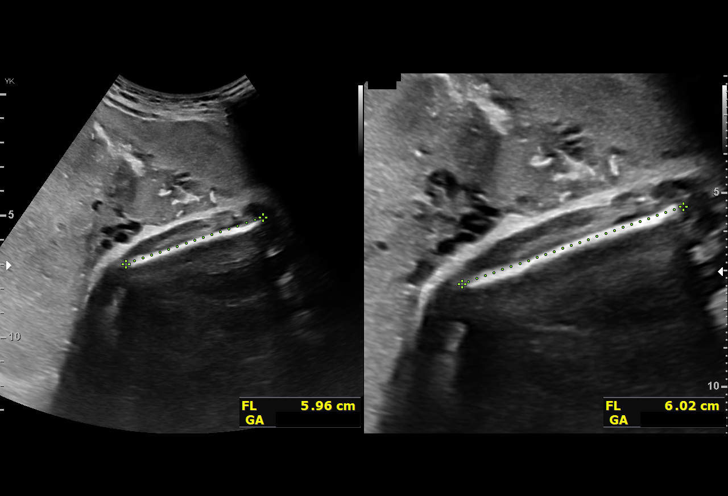
[im 49/95]
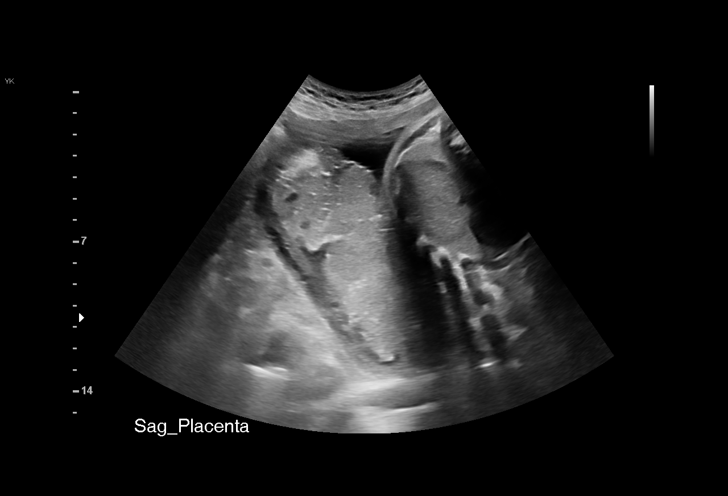
[im 56/95]
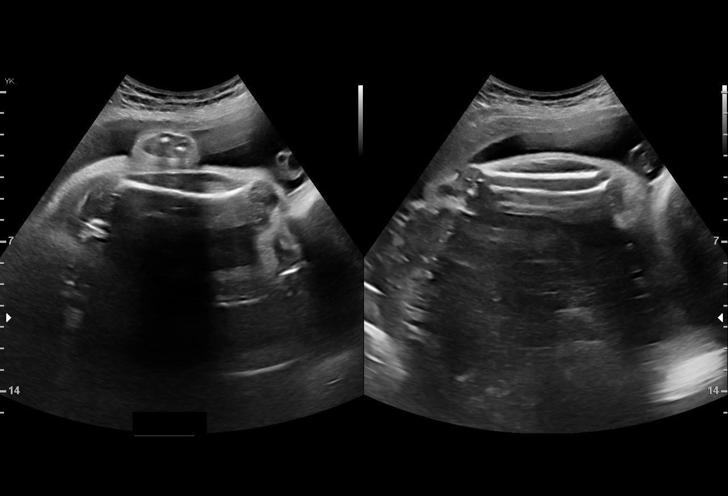
[im 63/95]
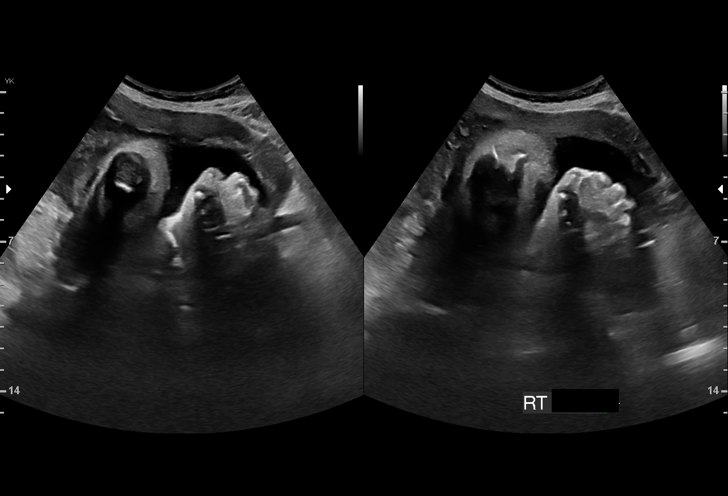
[im 70/95]
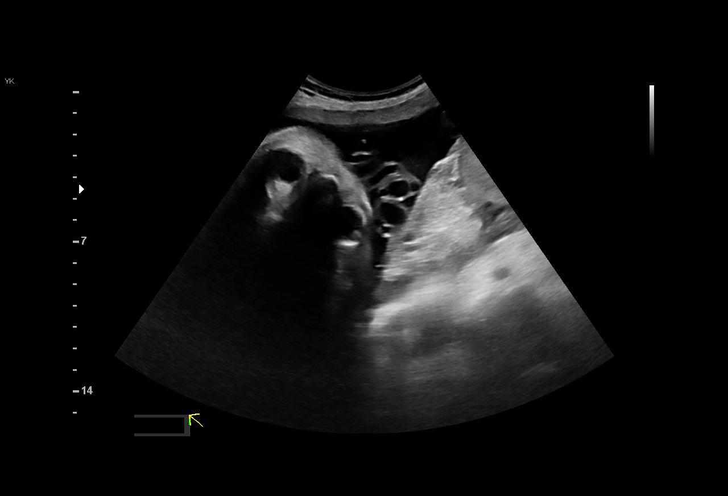
[im 77/95]
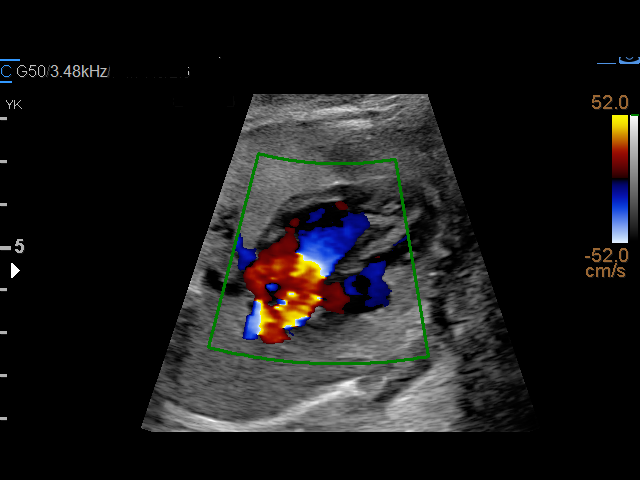
[im 84/95]
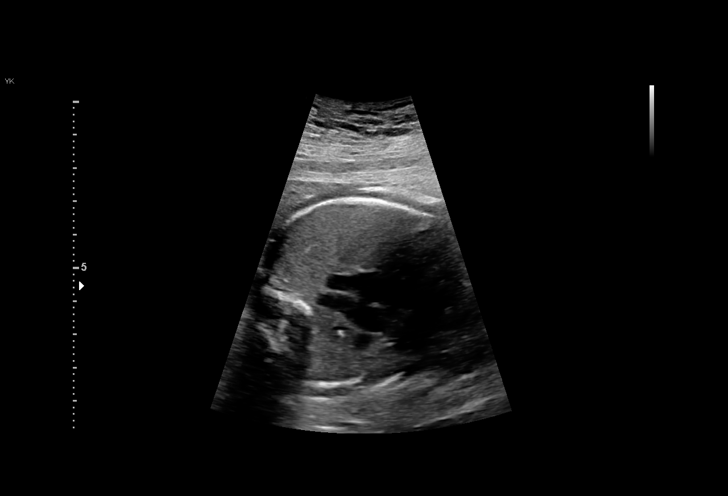
[im 91/95]
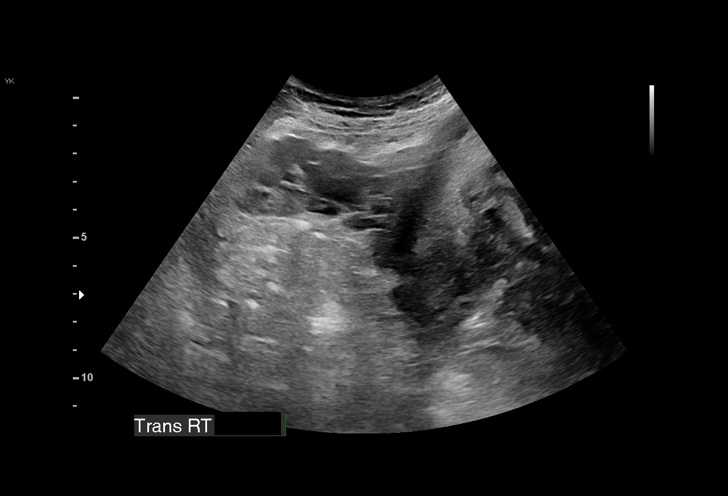

[13 of 28 positions shown; findings below may reference images not displayed]

BALBI

                                                      BALBI

Indications

 Encounter for antenatal screening for
 malformations
 Encounter for uncertain dates
 Insufficient Prenatal Care
 Poor obstetric history: Prior fetal
 macrosomia, antepartum
 Previous cesarean delivery, antepartum (for
 fetal macrosomia)
 33 weeks gestation of pregnancy
Fetal Evaluation

 Num Of Fetuses:          1
 Fetal Heart Rate(bpm):   120
 Cardiac Activity:        Observed
 Presentation:            Cephalic
 Placenta:                Posterior Fundal
 P. Cord Insertion:       Visualized, central

 Amniotic Fluid
 AFI FV:      Within normal limits

 AFI Sum(cm)     %Tile       Largest Pocket(cm)
 16.3            59

 RUQ(cm)       RLQ(cm)       LUQ(cm)        LLQ(cm)
 6.4           3.9           2.9            3
Biometry

 BPD:      82.9  mm     G. Age:  33w 2d         42  %    CI:        74.06   %    70 - 86
                                                         FL/HC:       19.9  %    19.9 -
 HC:      305.9  mm     G. Age:  34w 0d         29  %    HC/AC:       0.99       0.96 -
 AC:       309   mm     G. Age:  34w 6d         87  %    FL/BPD:      73.6  %    71 - 87
 FL:         61  mm     G. Age:  31w 5d          6  %    FL/AC:       19.7  %    20 - 24
 Est. FW:    4462   gm          5 lb     52  %
OB History

 Gravidity:    3         Term:   2
 Living:       2
Gestational Age

 U/S Today:     33w 3d                                        EDD:   07/23/20
 Best:          33w 3d     Det. By:  U/S (06/07/20)           EDD:   07/23/20
Anatomy

 Cranium:               Appears normal         Aortic Arch:            Appears normal
 Cavum:                 Appears normal         Ductal Arch:            Appears normal
 Ventricles:            Appears normal         Diaphragm:              Appears normal
 Choroid Plexus:        Appears normal         Stomach:                Appears normal, left
                                                                       sided
 Cerebellum:            Appears normal         Abdomen:                Appears normal
 Posterior Fossa:       Appears normal         Abdominal Wall:         Appears nml (cord
                                                                       insert, abd wall)
 Nuchal Fold:           Not applicable (>20    Cord Vessels:           Appears normal (3
                        wks GA)                                        vessel cord)
 Face:                  Appears normal         Kidneys:                Appear normal
                        (orbits and profile)
 Lips:                  Appears normal         Bladder:                Appears normal
 Thoracic:              Appears normal         Spine:                  Ltd views no
                                                                       intracranial signs of
                                                                       NTD
 Heart:                 Appears normal         Upper Extremities:      Present
                        (4CH, axis, and
                        situs)
 RVOT:                  Not well visualized    Lower Extremities:      Appears normal
 LVOT:                  Not well visualized

 Other:  RT arm, ulna and radius not visulazied due to baby position.  Hands
         and feet visualized. Lenses visualized. Fetus appears to be female.
         Technically difficult due to advanced gestational age.
Cervix Uterus Adnexa

 Cervix
 Not visualized (advanced GA >29wks)

 Uterus
 No abnormality visualized.

 Right Ovary
 No adnexal mass visualized.

 Left Ovary
 No adnexal mass visualized.
Comments

 This patient preseneted to the [facility] due to abdominal pain.
 She is a refugee from [place] and has not started
 prenatal care.
 The fetal biometery measurements are consistent with an
 EDC of 07/23/2020, making her 33 weeks and 2 days. There
 was normal amniotic fluid.
 The views of the fetal anatomy were limited due to her
 advanced gestational age.
 She should have another growth scan in 4 weeks.

## 2023-01-12 NOTE — Congregational Nurse Program (Signed)
  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  obtaining treatment through [facility]--they come to her home every morning for observed treatment.    Encounter Details:  CNP Questionnaire - 01/12/23 1045       Questionnaire   Ask client: Do you give verbal consent for me to treat you today? Yes    Student Assistance [employer] Nurse    Location Patient Served  [facility]    Visit Setting with Client Organization    Patient Status Refugee    Insurance Medicaid    Insurance/Financial Assistance Referral N/A    Medication Have Medication Insecurities    Medical Provider Yes    Screening Referrals Made [facility]    Medical Referrals Made [facility]    Medical Appointment Made [facility]    Recently w/o PCP, now 1st time PCP visit completed due to CNs referral or appointment made Yes    Food N/A    Transportation N/A    Housing/Utilities N/A    Interpersonal Safety N/A    Interventions Advocate/Support;Navigate Healthcare System;Case Management;Counsel;Educate    Abnormal to Normal Screening Since Last CN Visit N/A    Screenings CN Performed Blood Pressure;Blood Glucose    Sent Client to Lab for: N/A    Did client attend any of the following based off CNs referral or appointments made? N/A    ED Visit Averted Yes    Life-Saving Intervention Made N/A            the patient came in to schedule appointment with pcp and is schedules for October 14th. She is able to drive herself to the appointment.   Nicole Cella Samirah Scarpati RN BSN PCCN  [facility] [phone]-cell [phone]-office

## 2023-02-06 IMAGING — US US MFM OB FOLLOW-UP
1 series · 13 of 28 positions shown · non-contrast
Comparison: none

[Series 1: us mfm ob follow-up · 64 acquisitions, 13 frames shown]
[im 3/64]
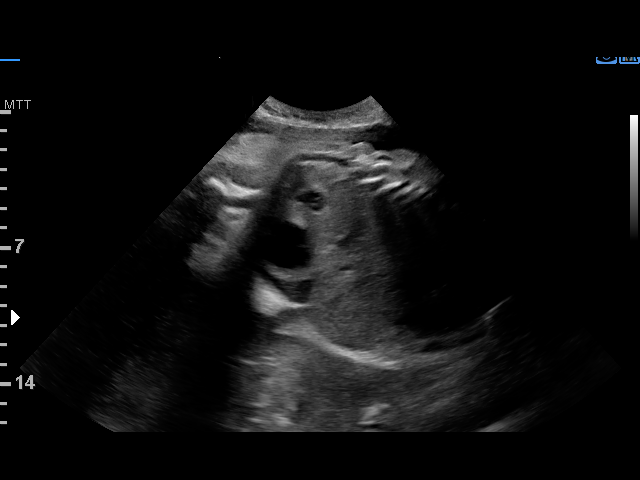
[im 8/64]
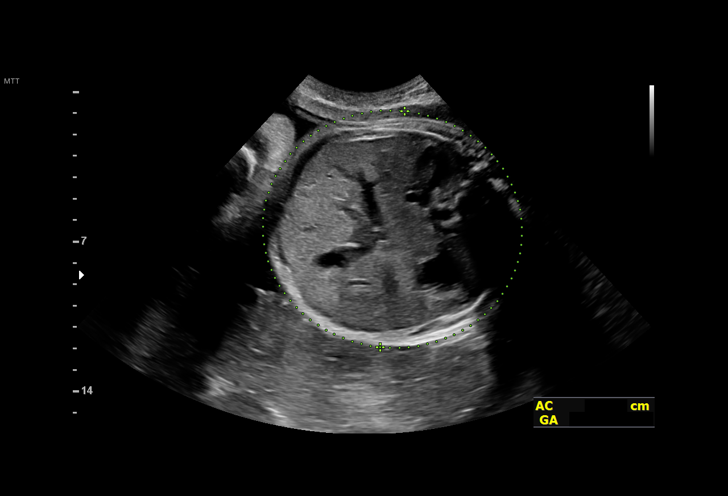
[im 12/64]
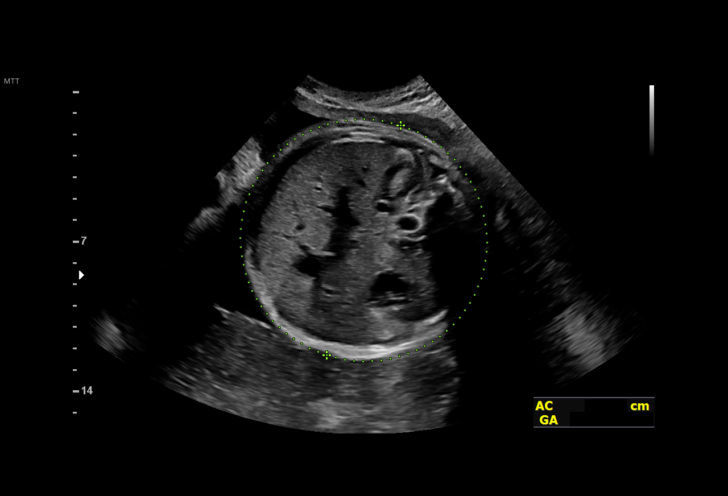
[im 17/64]
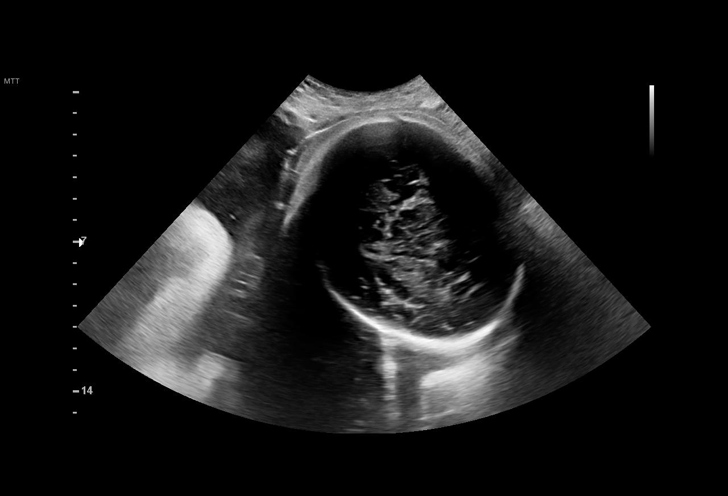
[im 22/64]
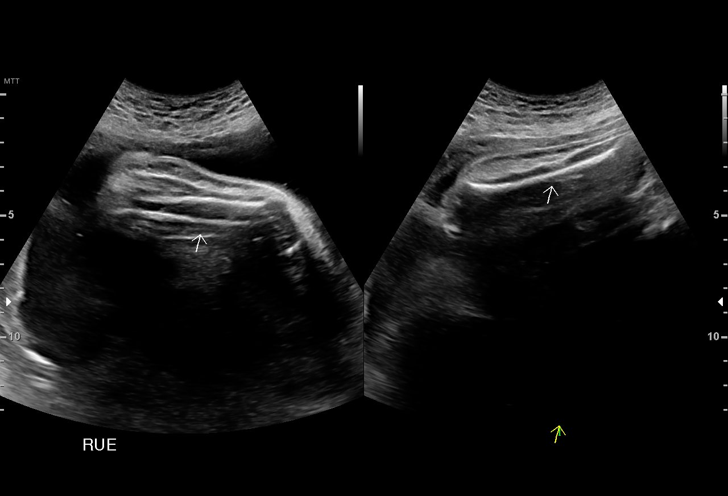
[im 26/64]
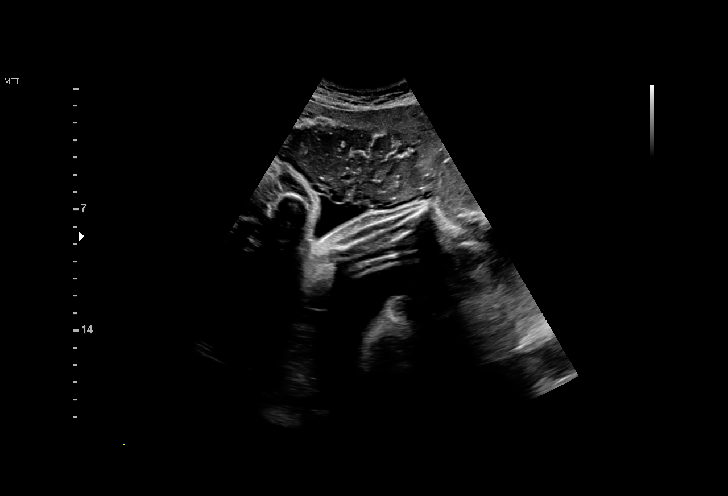
[im 33/64]
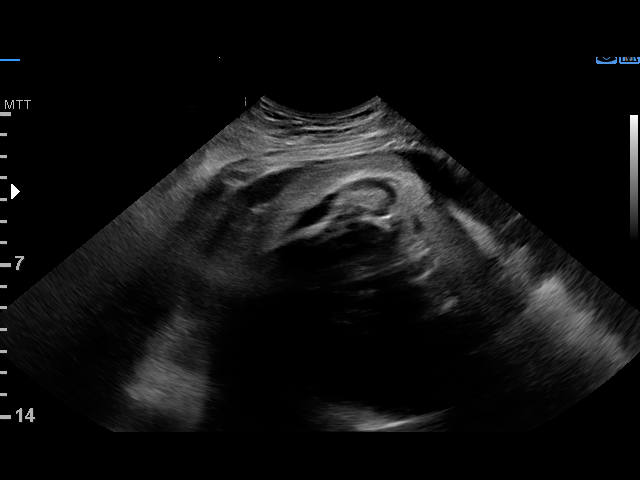
[im 38/64]
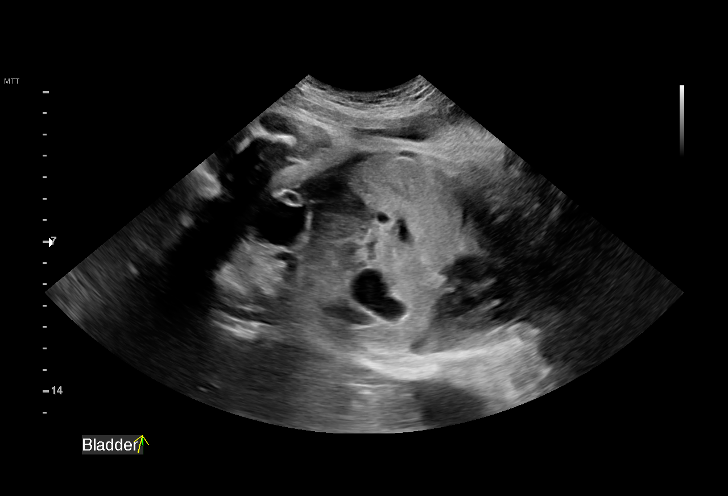
[im 43/64]
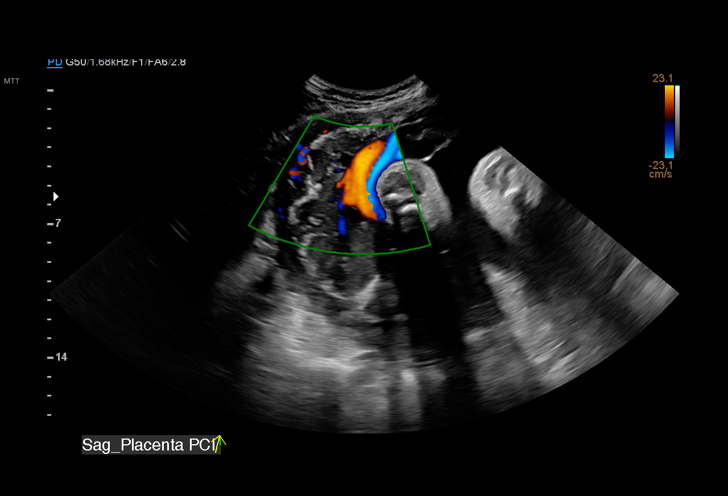
[im 47/64]
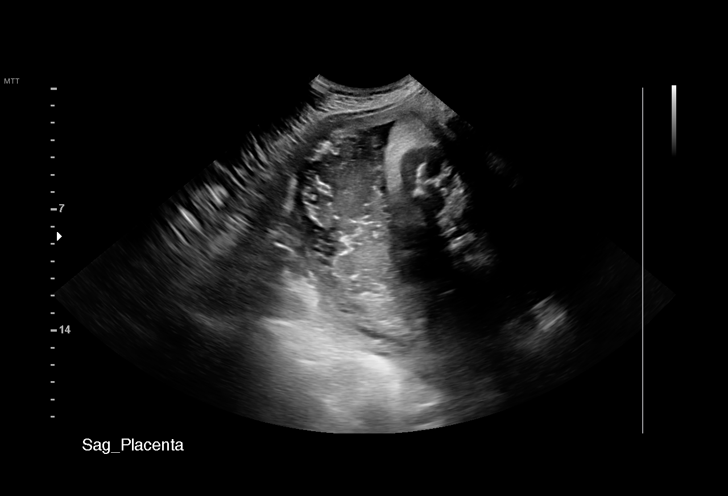
[im 52/64]
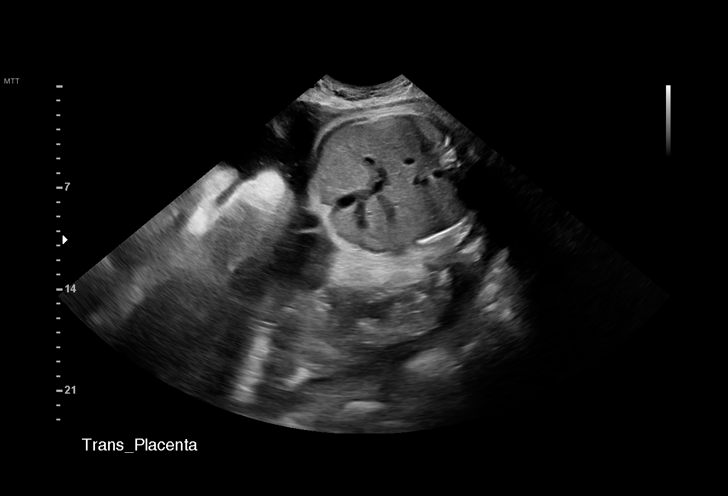
[im 57/64]
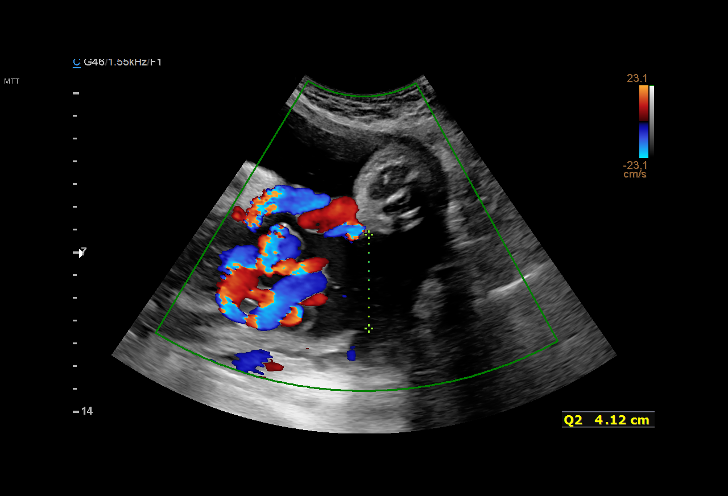
[im 61/64]
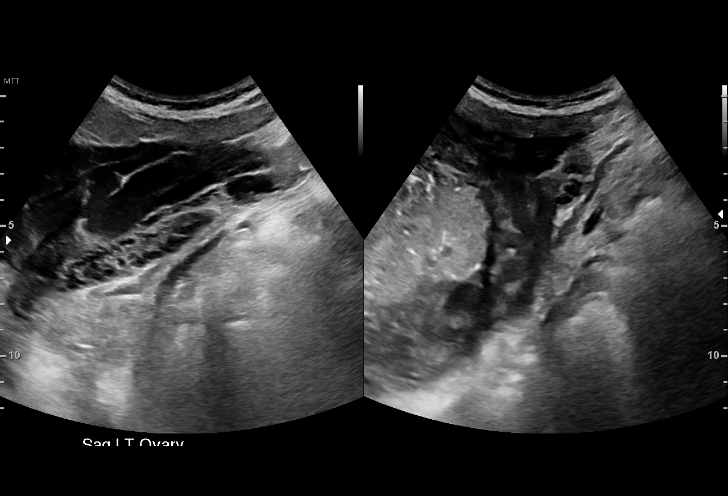

[13 of 28 positions shown; findings below may reference images not displayed]

Attending:        Giorgi Jumper        Secondary Phy.:   BRAIIAM DAINO
                                                            VISSER CNM
                                                            [REDACTED]. [HOSPITAL],
                   [id number]

Indications

 37 weeks gestation of pregnancy
 Insufficient Prenatal Care
 Poor obstetric history: Prior fetal
 macrosomia, antepartum
 Previous cesarean delivery, antepartum (for
 fetal macrosomia)
 Uterine size-date discrepancy, unspecified
 trimester
 Encounter for other antenatal screening
 follow-up
Fetal Evaluation

 Num Of Fetuses:         1
 Fetal Heart Rate(bpm):  126
 Cardiac Activity:       Observed
 Presentation:           Cephalic
 Placenta:               Posterior Fundal
 P. Cord Insertion:      Previously Visualized

 Amniotic Fluid
 AFI FV:      Within normal limits

 AFI Sum(cm)     %Tile       Largest Pocket(cm)
 18.8            72
 RUQ(cm)       RLQ(cm)       LUQ(cm)        LLQ(cm)
 6.8           5
Biometry

 BPD:      91.5  mm     G. Age:  37w 1d         64  %    CI:        75.87   %    70 - 86
                                                         FL/HC:      20.4   %    20.8 -
 HC:       333   mm     G. Age:  38w 0d         43  %    HC/AC:      0.92        0.92 -
 AC:      362.1  mm     G. Age:  40w 1d       > 99  %    FL/BPD:     74.1   %    71 - 87
 FL:       67.8  mm     G. Age:  34w 6d        4.6  %    FL/AC:      18.7   %    20 - 24

 LV:        8.7  mm

 Est. FW:    7945  gm    7 lb 11 oz      84  %
OB History

 Gravidity:    3         Term:   2
 Living:       2
Gestational Age

 U/S Today:     37w 4d                                        EDD:   07/21/20
 Best:          37w 2d     Det. By:  U/S  (06/07/20)          EDD:   07/23/20
Anatomy

 Cranium:               Previously seen        Aortic Arch:            Previously seen
 Cavum:                 Previously seen        Ductal Arch:            Previously seen
 Ventricles:            Appears normal         Diaphragm:              Appears normal
 Choroid Plexus:        Previously seen        Stomach:                Appears normal, left
                                                                       sided
 Cerebellum:            Previously seen        Abdomen:                Previously seen
 Posterior Fossa:       Previously seen        Abdominal Wall:         Previously seen
 Nuchal Fold:           Not applicable (>20    Cord Vessels:           Previously seen
                        wks GA)
 Face:                  Orbits and profile     Kidneys:                Appear normal
                        previously seen
 Lips:                  Previously seen        Bladder:                Appears normal
 Thoracic:              Previously seen        Spine:                  Limited views
                                                                       appear normal
 Heart:                 Appears normal         Upper Extremities:      Visualized
                        (4CH, axis, and                                previously
                        situs)
 RVOT:                  Not well visualized    Lower Extremities:      Previously seen
 LVOT:                  Not well visualized

 Other:  RT arm, ulna and radius not visulazied previously due to fetal
         position.  Hands, feet and lenses visualized previously. Female
         gender previously seen. Technically difficult due to advanced
         gestational
Cervix Uterus Adnexa

 Cervix
 Length:           4.29  cm.
 Not visualized (advanced GA >36wks)
 Uterus
 No abnormality visualized.

 Right Ovary
 Within normal limits.

 Left Ovary
 Within normal limits.

 Cul De Sac
 No free fluid seen.

 Adnexa
 No abnormality visualized.
Impression

 Late prenatal care.  Patient does not have gestational
 diabetes. Fetal growth is appropriate for gestational age.
 Amniotic fluid is normal.
 Cephalic presentation.
Recommendations

 -No follow-up appointments were ma[REDACTED]

## 2023-02-08 ENCOUNTER — Ambulatory Visit (INDEPENDENT_AMBULATORY_CARE_PROVIDER_SITE_OTHER): Payer: Medicaid Other | Admitting: Nurse Practitioner

## 2023-02-08 ENCOUNTER — Encounter: Payer: Self-pay | Admitting: Nurse Practitioner

## 2023-02-08 VITALS — BP 98/60 | HR 69 | Temp 97.9°F | Wt 176.6 lb

## 2023-02-08 DIAGNOSIS — N920 Excessive and frequent menstruation with regular cycle: Secondary | ICD-10-CM

## 2023-02-08 DIAGNOSIS — Z8611 Personal history of tuberculosis: Secondary | ICD-10-CM | POA: Diagnosis not present

## 2023-02-08 DIAGNOSIS — Z7689 Persons encountering health services in other specified circumstances: Secondary | ICD-10-CM | POA: Diagnosis not present

## 2023-02-08 DIAGNOSIS — M545 Low back pain, unspecified: Secondary | ICD-10-CM | POA: Diagnosis not present

## 2023-02-08 MED ORDER — NAPROXEN 500 MG PO TABS: 500.0000 mg | ORAL_TABLET | Freq: Two times a day (BID) | ORAL | 2 refills | Status: DC

## 2023-02-08 MED ORDER — NAPROXEN 500 MG PO TABS: 500.0000 mg | ORAL_TABLET | Freq: Two times a day (BID) | ORAL | 2 refills | Status: DC | PRN

## 2023-02-08 NOTE — Assessment & Plan Note (Signed)
This was seen in [place] while at a [facility]. She was also monitored patient reports she had a chest xray last week and was normal but she does not know where she had it done.

## 2023-02-08 NOTE — Patient Instructions (Signed)
You can take the naprosyn 1-2 days before your menstrual cycle to see if this helps with your pain. Also you can use the pain patch to see if that helps as well, this is over the counter.

## 2023-02-08 NOTE — Assessment & Plan Note (Signed)

## 2023-02-08 NOTE — Assessment & Plan Note (Signed)
Given Naprosyn to take 1-2 days before menstrual cycle. Will also check cbc

## 2023-02-08 NOTE — Progress Notes (Signed)
Jean Myers, CMA,acting as a Neurosurgeon for Jean Felts, FNP.,have documented all relevant documentation on the behalf of Jean Felts, FNP,as directed by  Jean Felts, FNP while in the presence of Jean Felts, FNP.  Subjective:  Patient ID: Jean Myers , female    DOB: [birth year] , 27 y.o.   MRN: [id number]  Chief Complaint  Patient presents with   Establish Care    HPI  Patient presents today to establish care, patient would like to discuss today her back pain during her cycle, she reports her first 2 days she bleeds heavier. After she had her last child she has had these symptoms. Sometimes will take Ibuprofen. Will help for a short time. LMP - 02/07/2023. Does come monthly. She is interested in pain medications to help with her pain. Jean Myers interpreter   Patient reports she has been here for 3 years after moving from [place]. Patient reports she got flu shot [facility]. Patient reports she hasn't been to any doctors in about 4 and half years.  She had been going to urgent care if needed. She is not working but is married. She has 3 daughters  - 34 y/o, 74 y/o and 2 y/o - all healthy.   Reports she had a chest xray for her green card last week in [place] but does not know where it was done. Reports everything was okay.    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  obtaining treatment through [facility]--they come to her home every morning for observed treatment.     History reviewed. No pertinent family history.   Current Outpatient Medications:    naproxen (NAPROSYN) 500 MG tablet, Take 1 tablet (500 mg total) by mouth 2 (two) times daily as needed. With meals, Disp: 60 tablet, Rfl: 2   No Known Allergies   Review of Systems  Constitutional: Negative.  Negative for activity change and fatigue.  Eyes:   Negative for visual disturbance.  Respiratory: Negative.  Negative for choking, shortness of breath and wheezing.   Cardiovascular: Negative.  Negative for chest pain, palpitations and leg swelling.  Gastrointestinal: Negative.        Abdominal cramping with menstrual cycle  Endocrine: Negative.  Negative for polydipsia, polyphagia and polyuria.  Musculoskeletal:  Positive for back pain (with menses).       Leg pain with menstrual cycle  Skin: Negative.   Neurological:  Negative for dizziness, weakness and headaches.  Psychiatric/Behavioral:  Negative for confusion. The patient is not nervous/anxious.      Today's Vitals   02/08/23 0958  BP: 98/60  Pulse: 69  Temp: 97.9 F (36.6 C)  TempSrc: Oral  Weight: 176 lb 9.6 oz (80.1 kg)  PainSc: 8   PainLoc: Back   Body mass index is 31.29 kg/m.  Wt Readings from Last 3 Encounters:  02/08/23 176 lb 9.6 oz (80.1 kg)  12/15/22 180 lb (81.6 kg)  10/21/21 162 lb (73.5 kg)     Objective:  Physical Exam Vitals reviewed.  Constitutional:      General: She is not in acute distress.    Appearance: Normal appearance.  Cardiovascular:     Rate and Rhythm: Normal rate and regular rhythm.     Pulses: Normal pulses.     Heart sounds: Normal heart  sounds. No murmur heard. Pulmonary:     Effort: Pulmonary effort is normal. No respiratory distress.     Breath sounds: Normal breath sounds. No wheezing.  Skin:    General: Skin is warm and dry.     Capillary Refill: Capillary refill takes less than 2 seconds.  Neurological:     General: No focal deficit present.     Mental Status: She is alert and oriented to person, place, and time.     Cranial Nerves: No cranial nerve deficit.     Motor: No weakness.  Psychiatric:        Mood and Affect: Mood normal.        Behavior: Behavior normal.        Thought Content: Thought content normal.        Judgment: Judgment normal.         Assessment And Plan:  Establishing care with new doctor,  encounter for Assessment & Plan: Patient is here to establish care. Went over patient medical, family, social and surgical history. Reviewed with patient their medications and any allergies  Reviewed with patient their sexual orientation, drug/tobacco and alcohol use Dicussed any new concerns with patient  recommended patient comes in for a physical exam and complete blood work.  Educated patient about the importance of annual screenings and immunizations.  Advised patient to eat a healthy diet along with exercise for atleast 30-45 min atleast 4-5 days of the week.      Acute bilateral low back pain without sciatica Assessment & Plan: Likely related to menstrual cycle. Given sample of salonpas.    Menorrhagia with regular cycle Assessment & Plan: Given Naprosyn to take 1-2 days before menstrual cycle. Will also check cbc   Orders: -     CMP14+EGFR -     CBC with Differential/Platelet -     TSH -     Naproxen; Take 1 tablet (500 mg total) by mouth 2 (two) times daily as needed. With meals  Dispense: 60 tablet; Refill: 2  History of TB (tuberculosis) Assessment & Plan: This was seen in [place] while at a [facility]. She was also monitored patient reports she had a chest xray last week and was normal but she does not know where she had it done.      Return for HM in March/April with PAP.  Patient was given opportunity to ask questions. Patient verbalized understanding of the plan and was able to repeat key elements of the plan. All questions were answered to their satisfaction.    Jean Sparrow, FNP, have reviewed all documentation for this visit. The documentation on 02/08/23 for the exam, diagnosis, procedures, and orders are all accurate and complete.   IF YOU HAVE BEEN REFERRED TO A SPECIALIST, IT MAY TAKE 1-2 WEEKS TO SCHEDULE/PROCESS THE REFERRAL. IF YOU HAVE NOT HEARD FROM US/SPECIALIST IN TWO WEEKS, PLEASE GIVE [place] A CALL AT [phone].

## 2023-02-08 NOTE — Assessment & Plan Note (Signed)
Likely related to menstrual cycle. Given sample of salonpas.

## 2023-02-09 LAB — CMP14+EGFR
ALT: 15 [IU]/L (ref 0–32)
AST: 20 [IU]/L (ref 0–40)
Albumin: 4.4 g/dL (ref 4.0–5.0)
Alkaline Phosphatase: 77 [IU]/L (ref 44–121)
BUN/Creatinine Ratio: 20 (ref 9–23)
BUN: 13 mg/dL (ref 6–20)
Bilirubin Total: 0.2 mg/dL (ref 0.0–1.2)
CO2: 25 mmol/L (ref 20–29)
Calcium: 10 mg/dL (ref 8.7–10.2)
Chloride: 102 mmol/L (ref 96–106)
Creatinine, Ser: 0.65 mg/dL (ref 0.57–1.00)
Globulin, Total: 2.7 g/dL (ref 1.5–4.5)
Glucose: 91 mg/dL (ref 70–99)
Potassium: 4.5 mmol/L (ref 3.5–5.2)
Sodium: 140 mmol/L (ref 134–144)
Total Protein: 7.1 g/dL (ref 6.0–8.5)
eGFR: 124 mL/min/{1.73_m2} (ref >59)

## 2023-02-09 LAB — CBC WITH DIFFERENTIAL/PLATELET
Basophils Absolute: 0 10*3/uL (ref 0.0–0.2)
Basos: 1 %
EOS (ABSOLUTE): 0.2 10*3/uL (ref 0.0–0.4)
Eos: 3 %
Hematocrit: 41.8 % (ref 34.0–46.6)
Hemoglobin: 13.6 g/dL (ref 11.1–15.9)
Immature Grans (Abs): 0 10*3/uL (ref 0.0–0.1)
Immature Granulocytes: 0 %
Lymphocytes Absolute: 2.3 10*3/uL (ref 0.7–3.1)
Lymphs: 33 %
MCH: 28.9 pg (ref 26.6–33.0)
MCHC: 32.5 g/dL (ref 31.5–35.7)
MCV: 89 fL (ref 79–97)
Monocytes Absolute: 0.4 10*3/uL (ref 0.1–0.9)
Monocytes: 5 %
Neutrophils Absolute: 4.2 10*3/uL (ref 1.4–7.0)
Neutrophils: 58 %
Platelets: 361 10*3/uL (ref 150–450)
RBC: 4.71 x10E6/uL (ref 3.77–5.28)
RDW: 12.1 % (ref 11.7–15.4)
WBC: 7.1 10*3/uL (ref 3.4–10.8)

## 2023-02-09 LAB — TSH: TSH: 1.95 u[IU]/mL (ref 0.450–4.500)

## 2023-04-30 IMAGING — CR DG CHEST 1V
1 series · 1 of 1 positions shown · non-contrast
Comparison: 05/29/2020

CLINICAL DATA: Tuberculosis post treatment

EXAM:
CHEST  1 VIEW

[w chest pa]
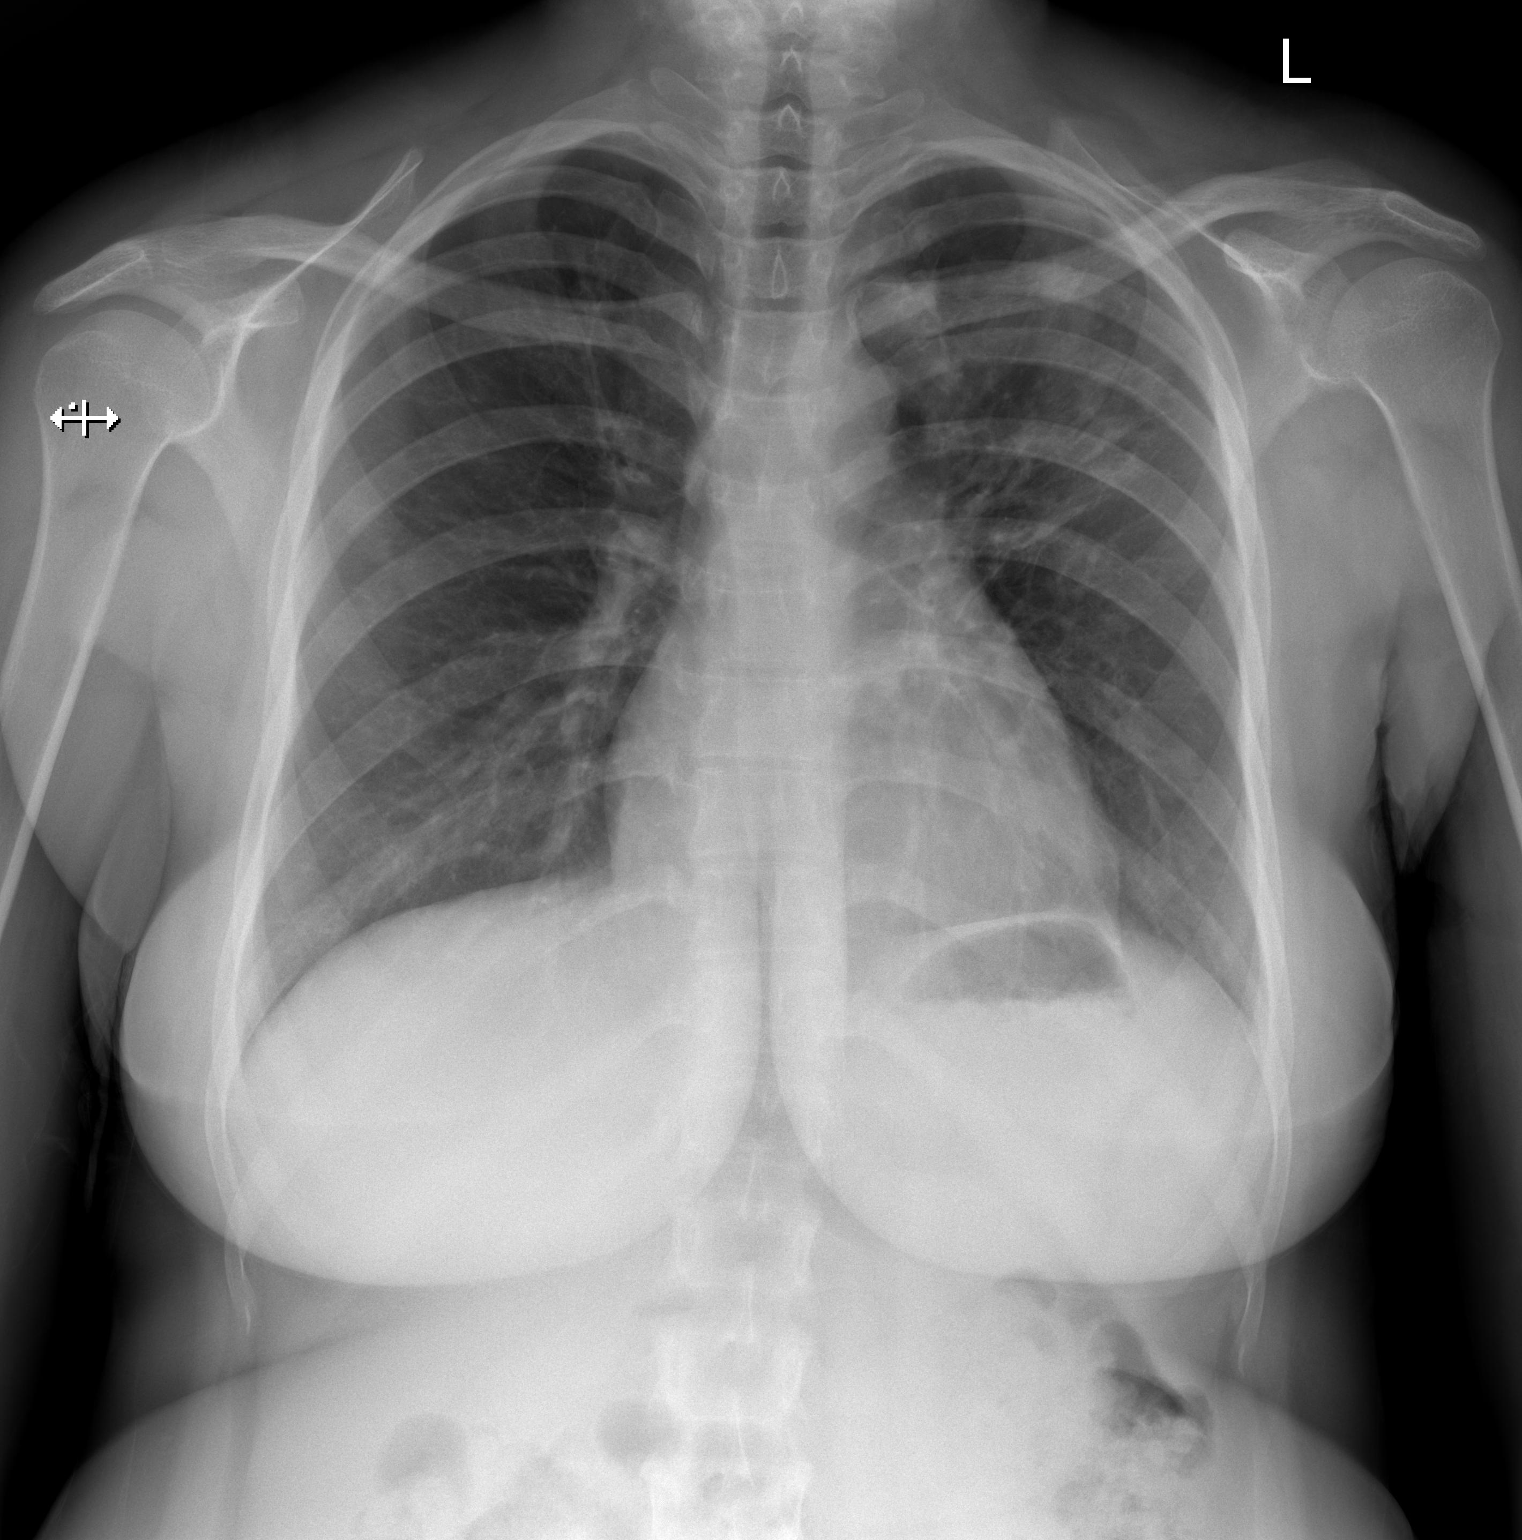

[1 of 1 positions shown; findings below may reference images not displayed]

FINDINGS: Normal heart size, mediastinal contours, and pulmonary vascularity.

Persistent infiltrate LEFT upper lobe.

Remaining lungs clear.

No pleural effusion or pneumothorax.

Osseous structures unremarkable.
IMPRESSION: Persistent LEFT upper lobe infiltrate consistent with pneumonia.

## 2023-05-11 ENCOUNTER — Encounter (HOSPITAL_COMMUNITY): Payer: Self-pay | Admitting: Emergency Medicine

## 2023-05-11 ENCOUNTER — Ambulatory Visit (HOSPITAL_COMMUNITY): Payer: Medicaid Other

## 2023-05-11 ENCOUNTER — Ambulatory Visit (HOSPITAL_COMMUNITY)
Admission: EM | Admit: 2023-05-11 | Discharge: 2023-05-11 | Disposition: A | Discharge status: Home / Self Care | Payer: Medicaid Other | Attending: Family Medicine | Admitting: Family Medicine

## 2023-05-11 DIAGNOSIS — R051 Acute cough: Secondary | ICD-10-CM

## 2023-05-11 DIAGNOSIS — R509 Fever, unspecified: Secondary | ICD-10-CM

## 2023-05-11 DIAGNOSIS — R053 Chronic cough: Secondary | ICD-10-CM

## 2023-05-11 MED ORDER — PROMETHAZINE-DM 6.25-15 MG/5ML PO SYRP: 5.0000 mL | ORAL_SOLUTION | Freq: Four times a day (QID) | ORAL | 0 refills | Status: DC | PRN

## 2023-05-11 MED ORDER — AZITHROMYCIN 250 MG PO TABS: 250.0000 mg | ORAL_TABLET | Freq: Every day | ORAL | 0 refills | Status: DC

## 2023-05-11 NOTE — ED Triage Notes (Addendum)
 Used interpretor  Pt states for 3 weeks having cough that is productive. For 3 days having body aches, headaches and fevers. Taking Ibuprofen  and Amoxicillin  for symptoms.

## 2023-05-12 NOTE — ED Provider Notes (Signed)
[facility]   [id number] 05/11/23 Arrival Time: 1950  ASSESSMENT & PLAN:  1. Acute cough   2. Fever, unspecified fever cause   3. Persistent cough for 3 weeks or longer    Discussed typical duration of likely viral illness. OTC symptom care as needed. VSS. Given duration of coughing will treat with: Discharge Medication List as of 05/11/2023  8:22 PM     START taking these medications   Details  azithromycin (ZITHROMAX) 250 MG tablet Take 1 tablet (250 mg total) by mouth daily. Take first 2 tablets together, then 1 every day until finished., Starting Wed 05/11/2023, Normal    promethazine-dextromethorphan (PROMETHAZINE-DM) 6.25-15 MG/5ML syrup Take 5 mLs by mouth 4 (four) times daily as needed for cough., Starting Wed 05/11/2023, Normal         Follow-up Information     Arnette Felts, FNP.   Specialty: General Practice Why: As needed. Contact information: [address] [place] [address] [phone]         [facility] [place].   Specialty: Urgent Care Why: If worsening or failing to improve as anticipated. Contact information: [address] [place] [address] [phone]                Reviewed expectations re: course of current medical issues. Questions answered. Outlined signs and symptoms indicating need for more acute intervention. Understanding verbalized. After Visit Summary given.   SUBJECTIVE: History from: Patient. Jean Myers is a 28 y.o. female. Used interpretor: Dari. Pt states for 3 weeks having cough; mostly dry but some production at times. With recent mild body aches, headaches.. Denies SOB/wheezing. Denies: fever. Normal PO intake without n/v/d.  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  obtaining treatment through [facility]--they come to her home every morning for observed treatment.   Social History   Socioeconomic History   Marital status: Married    Spouse name: Genavive Melikian    Number of children: 3   Years of education: Not on file   Highest education level: Not on file  Occupational History   Not on file  Tobacco Use   Smoking status: Never   Smokeless tobacco: Never  Vaping Use   Vaping status: Never Used  Substance and Sexual Activity   Alcohol use: Never   Drug use: Never   Sexual activity: Yes    Birth control/protection: None  Other Topics Concern   Not on file  Social History Narrative   Not on file   Social Drivers of Health   Financial Resource Strain: Not on file  Food Insecurity: Food Insecurity Present (10/21/2021)   Hunger Vital Sign    Worried About Running Out of Food in the Last Year: Sometimes true    Ran Out of Food in the Last Year: Sometimes true  Transportation Needs: Unmet Transportation Needs (06/18/2020)   PRAPARE - Transportation  Lack of Transportation (Medical): Yes    Lack of Transportation (Non-Medical): Yes  Physical Activity: Not on file  Stress: Not on file  Social Connections: Not on file  Intimate Partner Violence: Not on file   No family history on file. Past Surgical History:  Procedure Laterality Date   CESAREAN SECTION       Mardella Layman, MD 05/12/23 [phone]

## 2023-07-14 ENCOUNTER — Other Ambulatory Visit (HOSPITAL_COMMUNITY)
Admission: RE | Admit: 2023-07-14 | Discharge: 2023-07-14 | Disposition: A | Discharge status: Home / Self Care | Source: Ambulatory Visit | Attending: Nurse Practitioner | Admitting: Nurse Practitioner

## 2023-07-14 ENCOUNTER — Ambulatory Visit (INDEPENDENT_AMBULATORY_CARE_PROVIDER_SITE_OTHER): Payer: Medicaid Other | Admitting: Nurse Practitioner

## 2023-07-14 ENCOUNTER — Encounter: Payer: Self-pay | Admitting: Nurse Practitioner

## 2023-07-14 VITALS — BP 110/70 | HR 69 | Temp 97.8°F | Ht 62.0 in | Wt 181.0 lb

## 2023-07-14 DIAGNOSIS — R232 Flushing: Secondary | ICD-10-CM

## 2023-07-14 DIAGNOSIS — Z124 Encounter for screening for malignant neoplasm of cervix: Secondary | ICD-10-CM | POA: Insufficient documentation

## 2023-07-14 DIAGNOSIS — Z8611 Personal history of tuberculosis: Secondary | ICD-10-CM

## 2023-07-14 DIAGNOSIS — E6609 Other obesity due to excess calories: Secondary | ICD-10-CM | POA: Diagnosis not present

## 2023-07-14 DIAGNOSIS — Z6833 Body mass index (BMI) 33.0-33.9, adult: Secondary | ICD-10-CM | POA: Diagnosis not present

## 2023-07-14 DIAGNOSIS — E66811 Obesity, class 1: Secondary | ICD-10-CM | POA: Diagnosis not present

## 2023-07-14 DIAGNOSIS — Z0000 Encounter for general adult medical examination without abnormal findings: Secondary | ICD-10-CM | POA: Diagnosis not present

## 2023-07-14 DIAGNOSIS — Z2821 Immunization not carried out because of patient refusal: Secondary | ICD-10-CM

## 2023-07-14 NOTE — Patient Instructions (Signed)
Goal to exercise 150 minutes per week with at least 2 days of strength training Encouraged to park further when at the store, take stairs instead of elevators and to walk in place during commercials. Increase water intake to at least one gallon of water daily.

## 2023-07-14 NOTE — Progress Notes (Signed)
 [name], CMA,acting as a Neurosurgeon for Arnette Felts, FNP.,have documented all relevant documentation on the behalf of Arnette Felts, FNP,as directed by  Arnette Felts, FNP while in the presence of Arnette Felts, FNP.  Subjective:    Patient ID: Jean Myers , female    DOB: Jul 15, 1995 , 28 y.o.   MRN: [id number]  Chief Complaint  Patient presents with   Annual Exam    HPI  Patient presents today for HM. She is here today with Mahin (interpreter).  Patient reports compliance with medication. Patient denies any chest pain, SOB, or headaches. Patient has no concerns today. She has been getting hot and started taking fish oil which. History of TB when she first came and treated had a f/u CXR when getting her green card she is from [place].       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  obtaining treatment through [facility]--they come to her home every morning for observed treatment.     History reviewed. No pertinent family history.   Current Outpatient Medications:    Omega-3 Fatty Acids (FISH OIL MINIS) 500 MG CAPS, Take 500 mg by mouth daily., Disp: , Rfl:    No Known Allergies    The patient states she uses none for birth control. Patient's last menstrual period was 06/20/2023.. Negative for Dysmenorrhea and Negative for Menorrhagia. Negative for: breast discharge, breast lump(s), breast pain and breast self exam. Associated symptoms include abnormal vaginal bleeding. Pertinent negatives include abnormal bleeding (hematology), anxiety, decreased libido, depression, difficulty falling sleep, dyspareunia, history of infertility, nocturia, sexual dysfunction, sleep disturbances, urinary incontinence, urinary urgency, vaginal discharge and vaginal itching. Diet regular. The patient states her exercise level is minimal - every now and then will do stretching.   The patient's tobacco use is:  Social History   Tobacco Use   Smoking Status Never  Smokeless Tobacco Never  She has been exposed to passive smoke. The patient's alcohol use is:  Social History   Substance and Sexual Activity  Alcohol Use Never  Additional information: Last pap unknown, next one scheduled for today.    Review of Systems  Constitutional: Negative.   HENT: Negative.    Eyes: Negative.   Respiratory: Negative.    Cardiovascular: Negative.   Gastrointestinal: Negative.   Endocrine: Negative.   Genitourinary: Negative.   Musculoskeletal: Negative.   Skin: Negative.   Allergic/Immunologic: Negative.   Neurological: Negative.   Hematological: Negative.   Psychiatric/Behavioral: Negative.       Today's Vitals   07/14/23 1000  BP: 110/70  Pulse: 69  Temp: 97.8 F (36.6 C)  TempSrc: Oral  Weight: 181 lb (82.1 kg)  Height: 5\' 2"  (1.575 m)  PainSc: 0-No pain   Body mass index is 33.11 kg/m.  Wt Readings from Last 3 Encounters:  07/14/23 181 lb (82.1 kg)  02/08/23 176 lb 9.6 oz (80.1 kg)  12/15/22 180 lb (81.6 kg)     Objective:  Physical Exam Constitutional:      General: She is not in acute distress.    Appearance: Normal appearance. She is well-developed. She is obese.  HENT:     Head: Normocephalic and atraumatic.     Right Ear: Hearing, tympanic membrane, ear canal and external ear normal. There is no impacted cerumen.     Left Ear: Hearing, tympanic membrane, ear canal and external ear normal. There is no impacted cerumen.  Nose: Nose normal.     Mouth/Throat:     Mouth: Mucous membranes are moist.  Eyes:     General: Lids are normal.     Extraocular Movements: Extraocular movements intact.     Conjunctiva/sclera: Conjunctivae normal.     Pupils: Pupils are equal, round, and reactive to light.     Funduscopic exam:    Right eye: No papilledema.        Left eye: No papilledema.  Neck:     Thyroid: No thyroid mass.     Vascular: No carotid bruit.  Cardiovascular:     Rate and Rhythm: Normal rate  and regular rhythm.     Pulses: Normal pulses.     Heart sounds: Normal heart sounds. No murmur heard. Pulmonary:     Effort: Pulmonary effort is normal. No respiratory distress.     Breath sounds: Normal breath sounds. No wheezing.  Chest:     Chest wall: No mass.  Breasts:    Tanner Score is 5.     Right: Normal. No mass or tenderness.     Left: Normal. No mass or tenderness.  Abdominal:     General: Abdomen is flat. Bowel sounds are normal. There is no distension.     Palpations: Abdomen is soft.     Tenderness: There is no abdominal tenderness.  Genitourinary:    Rectum: Guaiac result negative.  Musculoskeletal:        General: No swelling. Normal range of motion.     Cervical back: Full passive range of motion without pain, normal range of motion and neck supple.     Right lower leg: No edema.     Left lower leg: No edema.  Lymphadenopathy:     Upper Body:     Right upper body: No supraclavicular, axillary or pectoral adenopathy.     Left upper body: No supraclavicular, axillary or pectoral adenopathy.  Skin:    General: Skin is warm and dry.     Capillary Refill: Capillary refill takes less than 2 seconds.  Neurological:     General: No focal deficit present.     Mental Status: She is alert and oriented to person, place, and time.     Cranial Nerves: No cranial nerve deficit.     Sensory: No sensory deficit.  Psychiatric:        Mood and Affect: Mood normal.        Behavior: Behavior normal.        Thought Content: Thought content normal.        Judgment: Judgment normal.      Assessment And Plan:     Encounter for annual health examination Assessment & Plan: Behavior modifications discussed and diet history reviewed.   Pt will continue to exercise regularly and modify diet with low GI, plant based foods and decrease intake of processed foods.  Recommend intake of daily multivitamin, Vitamin D, and calcium.  Recommend monthly self breast exams for preventive  screenings, as well as recommend immunizations that include influenza, TDAP   Orders: -     CBC with Differential/Platelet -     CMP14+EGFR -     Hemoglobin A1c -     Lipid panel  History of TB (tuberculosis) Assessment & Plan: She has had a repeat CXR in order to get her green card, reports was negative for active TB   Encounter for Papanicolaou smear of cervix -     Cytology - PAP  Influenza vaccination declined  Assessment & Plan: Patient declined influenza vaccination at this time. Patient is aware that influenza vaccine prevents illness in 70% of healthy people, and reduces hospitalizations to 30-70% in elderly. This vaccine is recommended annually. Education has been provided regarding the importance of this vaccine but patient still declined. Advised may receive this vaccine at local pharmacy or Health Dept.or vaccine clinic. Aware to provide a copy of the vaccination record if obtained from local pharmacy or Health Dept.  Pt is willing to accept risk associated with refusing vaccination.    COVID-19 vaccination declined Assessment & Plan: Declines covid 19 vaccine. Discussed risk of covid 9 and if she changes her mind about the vaccine to call the office. Education has been provided regarding the importance of this vaccine but patient still declined. Advised may receive this vaccine at local pharmacy or Health Dept.or vaccine clinic. Aware to provide a copy of the vaccination record if obtained from local pharmacy or Health Dept.  Encouraged to take multivitamin, vitamin d, vitamin c and zinc to increase immune system. Aware can call office if would like to have vaccine here at office. Verbalized acceptance and understanding.    Class 1 obesity due to excess calories without serious comorbidity with body mass index (BMI) of 33.0 to 33.9 in adult Assessment & Plan: She is encouraged to strive for BMI less than 30 to decrease cardiac risk. Advised to aim for at least 150 minutes  of exercise per week.    Hot flashes Assessment & Plan: Will check for thyroid abnormalities  Orders: -     TSH     Return for 1 year physical. Patient was given opportunity to ask questions. Patient verbalized understanding of the plan and was able to repeat key elements of the plan. All questions were answered to their satisfaction.   Arnette Felts, FNP  I, Arnette Felts, FNP, have reviewed all documentation for this visit. The documentation on 07/14/23 for the exam, diagnosis, procedures, and orders are all accurate and complete.

## 2023-07-15 LAB — CBC WITH DIFFERENTIAL/PLATELET
Basophils Absolute: 0 10*3/uL (ref 0.0–0.2)
Basos: 1 %
EOS (ABSOLUTE): 0.2 10*3/uL (ref 0.0–0.4)
Eos: 3 %
Hematocrit: 38.5 % (ref 34.0–46.6)
Hemoglobin: 12.9 g/dL (ref 11.1–15.9)
Immature Grans (Abs): 0 10*3/uL (ref 0.0–0.1)
Immature Granulocytes: 0 %
Lymphocytes Absolute: 2.5 10*3/uL (ref 0.7–3.1)
Lymphs: 42 %
MCH: 29.1 pg (ref 26.6–33.0)
MCHC: 33.5 g/dL (ref 31.5–35.7)
MCV: 87 fL (ref 79–97)
Monocytes Absolute: 0.4 10*3/uL (ref 0.1–0.9)
Monocytes: 7 %
Neutrophils Absolute: 2.8 10*3/uL (ref 1.4–7.0)
Neutrophils: 47 %
Platelets: 300 10*3/uL (ref 150–450)
RBC: 4.43 x10E6/uL (ref 3.77–5.28)
RDW: 12.6 % (ref 11.7–15.4)
WBC: 5.9 10*3/uL (ref 3.4–10.8)

## 2023-07-15 LAB — TSH: TSH: 1.77 u[IU]/mL (ref 0.450–4.500)

## 2023-07-15 LAB — LIPID PANEL
Chol/HDL Ratio: 2.1 ratio (ref 0.0–4.4)
Cholesterol, Total: 109 mg/dL (ref 100–199)
HDL: 53 mg/dL (ref >39)
LDL Chol Calc (NIH): 43 mg/dL (ref 0–99)
Triglycerides: 54 mg/dL (ref 0–149)
VLDL Cholesterol Cal: 13 mg/dL (ref 5–40)

## 2023-07-15 LAB — CMP14+EGFR
ALT: 12 IU/L (ref 0–32)
AST: 17 IU/L (ref 0–40)
Albumin: 4.3 g/dL (ref 4.0–5.0)
Alkaline Phosphatase: 66 IU/L (ref 44–121)
BUN/Creatinine Ratio: 15 (ref 9–23)
BUN: 11 mg/dL (ref 6–20)
Bilirubin Total: 0.3 mg/dL (ref 0.0–1.2)
CO2: 25 mmol/L (ref 20–29)
Calcium: 9.5 mg/dL (ref 8.7–10.2)
Chloride: 101 mmol/L (ref 96–106)
Creatinine, Ser: 0.73 mg/dL (ref 0.57–1.00)
Globulin, Total: 2.4 g/dL (ref 1.5–4.5)
Glucose: 101 mg/dL — ABNORMAL HIGH (ref 70–99)
Potassium: 4.4 mmol/L (ref 3.5–5.2)
Sodium: 138 mmol/L (ref 134–144)
Total Protein: 6.7 g/dL (ref 6.0–8.5)
eGFR: 116 mL/min/{1.73_m2} (ref >59)

## 2023-07-15 LAB — CYTOLOGY - PAP: Diagnosis: NEGATIVE

## 2023-07-15 LAB — HEMOGLOBIN A1C
Est. average glucose Bld gHb Est-mCnc: 108 mg/dL
Hgb A1c MFr Bld: 5.4 % (ref 4.8–5.6)

## 2023-07-24 ENCOUNTER — Encounter: Payer: Self-pay | Admitting: Nurse Practitioner

## 2023-07-24 DIAGNOSIS — Z0000 Encounter for general adult medical examination without abnormal findings: Secondary | ICD-10-CM | POA: Insufficient documentation

## 2023-07-24 DIAGNOSIS — Z2821 Immunization not carried out because of patient refusal: Secondary | ICD-10-CM | POA: Insufficient documentation

## 2023-07-24 DIAGNOSIS — Z124 Encounter for screening for malignant neoplasm of cervix: Secondary | ICD-10-CM | POA: Insufficient documentation

## 2023-07-24 DIAGNOSIS — R232 Flushing: Secondary | ICD-10-CM | POA: Insufficient documentation

## 2023-07-24 DIAGNOSIS — E66811 Obesity, class 1: Secondary | ICD-10-CM | POA: Insufficient documentation

## 2023-07-24 NOTE — Assessment & Plan Note (Signed)

## 2023-07-24 NOTE — Assessment & Plan Note (Signed)
 She has had a repeat CXR in order to get her green card, reports was negative for active TB

## 2023-07-24 NOTE — Assessment & Plan Note (Signed)
 Will check for thyroid abnormalities

## 2023-07-24 NOTE — Assessment & Plan Note (Signed)
 Behavior modifications discussed and diet history reviewed.   Pt will continue to exercise regularly and modify diet with low GI, plant based foods and decrease intake of processed foods.  Recommend intake of daily multivitamin, Vitamin D, and calcium.  Recommend monthly self breast exams for preventive screenings, as well as recommend immunizations that include influenza, TDAP

## 2023-07-24 NOTE — Assessment & Plan Note (Signed)
 She is encouraged to strive for BMI less than 30 to decrease cardiac risk. Advised to aim for at least 150 minutes of exercise per week.

## 2023-07-24 NOTE — Assessment & Plan Note (Signed)

## 2023-11-16 ENCOUNTER — Ambulatory Visit: Payer: Self-pay

## 2023-11-16 NOTE — Telephone Encounter (Signed)
 FYI Only or Action Required?: FYI only for provider.  Patient was last seen in primary care on 07/14/2023 by Georgina Speaks, FNP.  Called Nurse Triage reporting Back Pain.  Symptoms began several months ago.  Interventions attempted: OTC medications: ibuprofen .  Symptoms are: gradually worsening.  Triage Disposition: See PCP When Office is Open (Within 3 Days)  Patient/caregiver understands and will follow disposition?: Yes  Copied from CRM [id number]. Topic: Clinical - Red Word Triage >> Nov 16, 2023 11:34 AM Jean Myers wrote: [facility] that prompted transfer to Nurse Triage: Received call from Jean Myers, with [facility], calling on behalf of patient, stets she has back pain. Reason for Disposition  [1] MODERATE back pain (e.g., interferes with normal activities) AND [2] present > 3 days  Answer Assessment - Initial Assessment Questions 1. ONSET: When did the pain begin? (e.g., minutes, hours, days)     Chronic but worsening 2. LOCATION: Where does it hurt? (upper, mid or lower back)     Lower back  3. SEVERITY: How bad is the pain?  (e.g., Scale 1-10; mild, moderate, or severe)     Worsening 4. PATTERN: Is the pain constant? (e.g., yes, no; constant, intermittent)      Constant 5. RADIATION: Does the pain shoot into your legs or somewhere else?     Yes down legs to feet.  6. CAUSE:  What do you think is causing the back pain?      Previous strain 7. BACK OVERUSE:  Any recent lifting of heavy objects, strenuous work or exercise?     Working 8. MEDICINES: What have you taken so far for the pain? (e.g., nothing, acetaminophen , NSAIDS)     Ibuprofen  only helped for one hour.  9. NEUROLOGIC SYMPTOMS: Do you have any weakness, numbness, or problems with bowel/bladder control?     Denies 10. OTHER SYMPTOMS: Do you have any other symptoms? (e.g., fever, abdomen pain, burning with urination, blood in urine)       Denies  Additional info:  Jean Myers from  [employer] calling with patient to interpret. Patient requesting appointment to evaluate worsening back pain.  Protocols used: Back Pain-A-AH

## 2023-11-17 ENCOUNTER — Ambulatory Visit (INDEPENDENT_AMBULATORY_CARE_PROVIDER_SITE_OTHER): Payer: Self-pay | Admitting: Family Medicine

## 2023-11-17 ENCOUNTER — Encounter: Payer: Self-pay | Admitting: Family Medicine

## 2023-11-17 VITALS — BP 110/60 | HR 64 | Temp 98.9°F | Ht 62.0 in | Wt 191.0 lb

## 2023-11-17 DIAGNOSIS — E66811 Obesity, class 1: Secondary | ICD-10-CM

## 2023-11-17 DIAGNOSIS — M79604 Pain in right leg: Secondary | ICD-10-CM | POA: Diagnosis not present

## 2023-11-17 DIAGNOSIS — M545 Low back pain, unspecified: Secondary | ICD-10-CM | POA: Diagnosis not present

## 2023-11-17 DIAGNOSIS — Z6834 Body mass index (BMI) 34.0-34.9, adult: Secondary | ICD-10-CM

## 2023-11-17 DIAGNOSIS — M79605 Pain in left leg: Secondary | ICD-10-CM | POA: Diagnosis not present

## 2023-11-17 DIAGNOSIS — E6609 Other obesity due to excess calories: Secondary | ICD-10-CM

## 2023-11-17 MED ORDER — DICLOFENAC SODIUM 75 MG PO TBEC
75.0000 mg | DELAYED_RELEASE_TABLET | Freq: Two times a day (BID) | ORAL | 0 refills | Status: AC | PRN
Start: 2023-11-17 — End: ?

## 2023-11-17 MED ORDER — TRIAMCINOLONE ACETONIDE 40 MG/ML IJ SUSP
60.0000 mg | Freq: Once | INTRAMUSCULAR | Status: AC
Start: 2023-11-17 — End: 2023-11-17
  Administered 2023-11-17: 60 mg via INTRAMUSCULAR

## 2023-11-17 MED ORDER — METHOCARBAMOL 500 MG PO TABS
500.0000 mg | ORAL_TABLET | Freq: Two times a day (BID) | ORAL | 0 refills | Status: DC | PRN
Start: 2023-11-17 — End: 2024-02-23

## 2023-11-17 NOTE — Progress Notes (Signed)
 I,Jameka J Llittleton, CMA,acting as a Neurosurgeon for [employer], NP.,have documented all relevant documentation on the behalf of Bruna Creighton, NP,as directed by  Bruna Creighton, NP while in the presence of Bruna Creighton, NP.  Subjective:  Patient ID: Jean Myers , female    DOB: 18-Apr-1996 , 28 y.o.   MRN: [id number]  Chief Complaint  Patient presents with   Back Pain    Patient presents today for low back pain. The pain radiates down to her leg. Patient also reports the pain has worsened about 2 weeks ago.     HPI Discussed the use of AI scribe software for clinical note transcription with the patient, who gave verbal consent to proceed.  History of Present Illness    Jean Myers is a 28 year old female that presents to the clinic today for complaints of back pain.  She states that she has had back pain before but in the past 2 weeks the pain exacerbated and now she rates her pain a 10 out of 10 patient wants to be evaluated today stating that the pain has limited her ability to walk and functional properly at home and so she needs relief she more than that has young children at home and she wants to be able to take care of her children.patient just migrated from [place] and speaks Dari, we used interpreter services for this visit.interpreter's name is Alham [id number].   she speaks [place].     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  obtaining treatment through [facility]--they come to her home every morning for observed treatment.     History reviewed. No pertinent family history.   Current Outpatient Medications:    diclofenac  (VOLTAREN ) 75 MG EC tablet, Take 1 tablet (75 mg total) by mouth 2 (two) times daily as needed., Disp: 30 tablet, Rfl: 0   methocarbamol  (ROBAXIN ) 500 MG tablet, Take 1 tablet (500 mg total) by mouth 2 (two) times daily as needed for up to 15 days for muscle spasms., Disp: 30 tablet, Rfl: 0   Omega-3 Fatty Acids (FISH  OIL MINIS) 500 MG CAPS, Take 500 mg by mouth daily., Disp: , Rfl:    No Known Allergies   Review of Systems  Constitutional: Negative.   HENT: Negative.    Respiratory: Negative.    Cardiovascular: Negative.   Gastrointestinal: Negative.   Musculoskeletal:  Positive for back pain.     Today's Vitals   11/17/23 1433  BP: 110/60  Pulse: 64  Temp: 98.9 F (37.2 C)  Weight: 191 lb (86.6 kg)  Height: 5' 2 (1.575 m)  PainSc: 5   PainLoc: Back   Body mass index is 34.93 kg/m.  Wt Readings from Last 3 Encounters:  11/17/23 191 lb (86.6 kg)  07/14/23 181 lb (82.1 kg)  02/08/23 176 lb 9.6 oz (80.1 kg)    The ASCVD Risk score (Arnett DK, et al., 2019) failed to calculate for the following reasons:   The 2019 ASCVD risk score is only valid for ages 73 to 60  Objective:  Physical Exam HENT:     Head: Normocephalic.  Cardiovascular:     Rate and Rhythm: Normal rate and regular rhythm.  Pulmonary:     Effort: Pulmonary effort is normal.     Breath sounds: Normal breath sounds.  Musculoskeletal:        General: Tenderness present.  Skin:    General: Skin is warm.  Neurological:  General: No focal deficit present.     Mental Status: She is alert and oriented to person, place, and time.         Assessment And Plan:  Lumbar pain with radiation down both legs -     Diclofenac  Sodium; Take 1 tablet (75 mg total) by mouth 2 (two) times daily as needed.  Dispense: 30 tablet; Refill: 0 -     Methocarbamol ; Take 1 tablet (500 mg total) by mouth 2 (two) times daily as needed for up to 15 days for muscle spasms.  Dispense: 30 tablet; Refill: 0 -     Triamcinolone  Acetonide -     Ambulatory referral to Orthopedics  Class 1 obesity due to excess calories without serious comorbidity with body mass index (BMI) of 34.0 to 34.9 in adult Assessment & Plan: She is encouraged to strive for BMI less than 30 to decrease cardiac risk. Advised to aim for at least 150 minutes of exercise  per week.      Return if symptoms worsen or fail to improve, for keep appt.  Patient was given opportunity to ask questions. Patient verbalized understanding of the plan and was able to repeat key elements of the plan. All questions were answered to their satisfaction.   I, Bruna Creighton, NP, have reviewed all documentation for this visit. The documentation on 11/28/2023 for the exam, diagnosis, procedures, and orders are all accurate and complete.    IF YOU HAVE BEEN REFERRED TO A SPECIALIST, IT MAY TAKE 1-2 WEEKS TO SCHEDULE/PROCESS THE REFERRAL. IF YOU HAVE NOT HEARD FROM US /SPECIALIST IN TWO WEEKS, PLEASE GIVE US  A CALL AT [phone].

## 2023-11-28 ENCOUNTER — Ambulatory Visit (INDEPENDENT_AMBULATORY_CARE_PROVIDER_SITE_OTHER): Admitting: Internal Medicine

## 2023-11-28 ENCOUNTER — Other Ambulatory Visit: Payer: Self-pay

## 2023-11-28 ENCOUNTER — Encounter: Payer: Self-pay | Admitting: Internal Medicine

## 2023-11-28 VITALS — BP 104/62 | HR 68 | Temp 98.2°F | Ht 61.0 in | Wt 187.7 lb

## 2023-11-28 DIAGNOSIS — L309 Dermatitis, unspecified: Secondary | ICD-10-CM | POA: Diagnosis not present

## 2023-11-28 DIAGNOSIS — Z758 Other problems related to medical facilities and other health care: Secondary | ICD-10-CM | POA: Diagnosis not present

## 2023-11-28 DIAGNOSIS — E66811 Obesity, class 1: Secondary | ICD-10-CM | POA: Insufficient documentation

## 2023-11-28 DIAGNOSIS — Z603 Acculturation difficulty: Secondary | ICD-10-CM

## 2023-11-28 DIAGNOSIS — M545 Low back pain, unspecified: Secondary | ICD-10-CM | POA: Insufficient documentation

## 2023-11-28 MED ORDER — CLOBETASOL PROPIONATE 0.05 % EX OINT: TOPICAL_OINTMENT | CUTANEOUS | 1 refills | Status: AC

## 2023-11-28 NOTE — Assessment & Plan Note (Signed)
 She is encouraged to strive for BMI less than 30 to decrease cardiac risk. Advised to aim for at least 150 minutes of exercise per week.

## 2023-11-28 NOTE — Patient Instructions (Signed)
 Hand dermatitis (allergic vs irritant contact dermatitis) Intermittent pruritus, erythema, and papules on the hands for the past four to five months. Symptoms improve with glove use during dishwashing. Differential diagnosis includes allergic vs irritant contact dermatitis. No vesicles present. Symptoms are localized to the dorsal aspect of the hand. Potential irritants include harsh soaps or allergens in personal products. Patch testing is recommended to identify specific allergens. Topical steroids are considered for flare management. Avoid topical steroids two weeks prior to patch testing and oral/systemic steroids four weeks prior. - Schedule patch testing to identify potential allergens. - Prescribe clobetasol  topical steroid for use during flare-ups, twice daily. - Advise against using clobetasol  two weeks prior to patch testing. - Provide samples of Vanicream for sensitive skin care. - Instruct to avoid oral steroids four weeks prior to patch testing.  Follow up : for patch testing. It was a pleasure meeting you in clinic today! Thank you for allowing me to participate in your care.  Rocky Endow, MD [facility]

## 2023-11-28 NOTE — Progress Notes (Signed)
 NEW PATIENT Date of Service/Encounter:   11/28/2023 Referring provider: Georgina Speaks, FNP Primary care provider: Georgina Speaks, FNP  Subjective:  Jean Myers is a 28 y.o. female presenting today for evaluation of pruritus and rash. History obtained from: chart review and patient. In person Dari interpeter present for interpreting.    Discussed the use of AI scribe software for clinical note transcription with the patient, who gave verbal consent to proceed.  History of Present Illness Jean Myers is a 28 year old female who presents with intermittent itching and redness on her hands. She is accompanied by her daughters.  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  obtaining treatment through [facility]--they come to her home every morning for observed treatment.   Medication List:  Current Outpatient Medications  Medication Sig Dispense Refill   clobetasol  ointment  (TEMOVATE ) 0.05 % Apply topically twice daily to BODY as needed for SEVERE red, sandpaper and thickened like rash.  Do not use on face, groin or armpits.  Use for up to two weeks at a time. 60 g 1   diclofenac  (VOLTAREN ) 75 MG EC tablet Take 1 tablet (75 mg total) by mouth 2 (two) times daily as needed. 30 tablet 0   methocarbamol  (ROBAXIN ) 500 MG tablet Take 1 tablet (500 mg total) by mouth 2 (two) times daily as needed for up to 15 days for muscle spasms. 30 tablet 0   Omega-3 Fatty Acids (FISH OIL MINIS) 500 MG CAPS Take 500 mg by mouth daily.     No current facility-administered medications for this visit.   Known Allergies:  No Known Allergies Past Surgical History: Past Surgical History:  Procedure Laterality Date   CESAREAN SECTION     Family History: History reviewed. No pertinent family history. 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   ROS:  All other systems negative except as noted per HPI.  Objective:  Blood pressure 104/62, pulse 68, temperature 98.2 F (36.8 C), temperature source Temporal, height 5' 1 (1.549 m), weight 187 lb 11.2 oz (85.1 kg), SpO2 97%. Body mass index is 35.47 kg/m. Physical Exam:  General Appearance:  Alert, cooperative, no distress, appears stated age  Head:  Normocephalic, without obvious abnormality, atraumatic  Eyes:  Conjunctiva clear, EOM's intact  Nose: Nares normal, no rhinorrhea  Throat: Lips, tongue normal; teeth and gums normal, normal posterior oropharynx  Neck: Supple, symmetrical  Lungs:   clear to auscultation bilaterally, Respirations unlabored, no coughing  Heart:  regular rate and rhythm  and no murmur, Appears well perfused  Extremities: No edema  Skin: Skin color, texture, turgor normal and no rashes or lesions on visualized portions of skin  Neurologic: No gross deficits   Diagnostics: Labs:  Lab Orders  No laboratory test(s) ordered today     Assessment and Plan   Assessment and Plan Assessment & Plan Hand dermatitis (allergic vs irritant contact dermatitis) Intermittent pruritus, erythema, and papules on the hands for the past four to five months. Symptoms improve with glove use during dishwashing. Differential diagnosis includes allergic vs irritant contact dermatitis. No vesicles present. Symptoms are localized to the dorsal aspect of the hand. Potential irritants include harsh soaps or allergens in personal products. Patch testing is recommended to identify specific allergens. Topical steroids are considered for flare management. Avoid topical steroids two weeks prior to patch testing and oral/systemic steroids four weeks prior. - Schedule patch testing to identify potential allergens. - Prescribe clobetasol  topical steroid for use during flare-ups, twice daily. - Advise against using clobetasol  two weeks prior to patch testing. - Provide samples of Vanicream for sensitive skin care. - Instruct to avoid oral steroids four weeks prior to patch testing.  Follow up : for patch testing. It was a pleasure meeting you in clinic today! Thank you for allowing me to participate in your care.  Rocky Endow, MD [facility]   Total Time: 47 minutes  Time spent on day of service preparing to see patient, obtaining and reviewing separately obtained history, performing examination, counseling and educating patient and family, ordering procedures, documenting clinical information in the health record.   This note in its entirety was forwarded to the Provider who requested this consultation.  Other: samples provided of vanicream  Thank you for your kind referral. I appreciate the opportunity to take part in Jean Myers's care. Please do not hesitate to contact me with questions.  Sincerely,  Rocky Endow, MD [facility] 

## 2023-12-15 ENCOUNTER — Other Ambulatory Visit (INDEPENDENT_AMBULATORY_CARE_PROVIDER_SITE_OTHER): Payer: Self-pay

## 2023-12-15 ENCOUNTER — Encounter: Payer: Self-pay | Admitting: Physical Medicine and Rehabilitation

## 2023-12-15 ENCOUNTER — Ambulatory Visit: Admitting: Physical Medicine and Rehabilitation

## 2023-12-15 DIAGNOSIS — M5442 Lumbago with sciatica, left side: Secondary | ICD-10-CM

## 2023-12-15 DIAGNOSIS — M5416 Radiculopathy, lumbar region: Secondary | ICD-10-CM

## 2023-12-15 DIAGNOSIS — M5441 Lumbago with sciatica, right side: Secondary | ICD-10-CM

## 2023-12-15 DIAGNOSIS — G8929 Other chronic pain: Secondary | ICD-10-CM

## 2023-12-15 DIAGNOSIS — M7918 Myalgia, other site: Secondary | ICD-10-CM | POA: Diagnosis not present

## 2023-12-15 MED ORDER — MELOXICAM 15 MG PO TABS: 15.0000 mg | ORAL_TABLET | Freq: Every day | ORAL | 0 refills | Status: AC

## 2023-12-15 NOTE — Progress Notes (Signed)
 Core Outcome Measures Index (COMI) Back Score  Average Pain 6  COMI Score 60 %

## 2023-12-15 NOTE — Progress Notes (Signed)
 Jean Myers - 28 y.o. female MRN [id number]  Date of birth: 06-08-1995  Office Visit Note: Visit Date: 12/15/2023 PCP: Georgina Speaks, FNP Referred by: Petrina Pries, NP  Subjective: Chief Complaint  Patient presents with   Lower Back - Pain   HPI: Jean Myers is a 28 y.o. female who comes in today per the request of Pries Petrina, NP for evaluation of chronic, worsening and severe bilateral lower back pain radiating to legs. Dari interpreter at bedside. Pain ongoing for over 2 years, states her pain worsened after receiving epidural for childbirth. Her pain is constant, no specific aggravating factors. She describes pain as sore and burning, currently rates as 6 out of 10. Some relief of pain with home exercise regimen, rest and use of medications. No history of formal physical therapy. Her PCP recently prescribed Voltaren  and Mobic , some relief of pain with these medications. Patient denies focal weakness, numbness and tingling. No recent trauma or falls.       Review of Systems  Musculoskeletal:  Positive for back pain and myalgias.  Neurological:  Negative for tingling, sensory change, focal weakness and weakness.  All other systems reviewed and are negative.  Otherwise per HPI.  Assessment & Plan: Visit Diagnoses:    ICD-10-CM   1. Chronic bilateral low back pain with bilateral sciatica  M54.42 XR Lumbar Spine 2-3 Views   M54.41 MR LUMBAR SPINE WO CONTRAST   G89.29 Ambulatory referral to Physical Therapy    2. Lumbar radiculopathy  M54.16 XR Lumbar Spine 2-3 Views    MR LUMBAR SPINE WO CONTRAST    Ambulatory referral to Physical Therapy    3. Myofascial pain syndrome  M79.18 MR LUMBAR SPINE WO CONTRAST    Ambulatory referral to Physical Therapy       Plan: Findings:  Chronic, worsening and severe bilateral lower back pain radiating to legs. Patient continues to have severe pain despite good conservative therapies such as home exercise regimen, rest and use of  medications. Patients clinical presentation and exam are complex, differentials include lumbar radiculopathy and myofascial pain syndrome. Her pain does seem to be more non dermatomal. I obtained lumbar radiographs in the office today that appear to be fairly normal for her age. Given her history of chronic pain and worsening pain post childbirth epidural I placed order for lumbar MRI imaging. I also placed order for short course of formal physical therapy with a focus on manual treatments and possible dry needling. She informed me that her insurance will not cover Voltaren , I sent in new prescription for Mobic . I encouraged patient to remain active and to work with PT to establish home exercise regimen. Her exam today was non focal, good strength noted to bilateral lower extremities. She is particularly tender upon light palpation of bilateral lumbar paraspinal regions. I will see her back for lumbar MRI review.     Meds & Orders:  Meds ordered this encounter  Medications   meloxicam  (MOBIC ) 15 MG tablet    Sig: Take 1 tablet (15 mg total) by mouth daily.    Dispense:  30 tablet    Refill:  0    Orders Placed This Encounter  Procedures   XR Lumbar Spine 2-3 Views   MR LUMBAR SPINE WO CONTRAST   Ambulatory referral to Physical Therapy    Follow-up: Return for Lumbar MRI review.   Procedures: No procedures performed      Clinical History: No specialty comments available.   She reports that she  has never smoked. She has never been exposed to tobacco smoke. She has never used smokeless tobacco.  Recent Labs    07/14/23 1058  HGBA1C 5.4    Objective:  VS:  HT:    WT:   BMI:     BP:   HR: bpm  TEMP: ( )  RESP:  Physical Exam Vitals and nursing note reviewed.  HENT:     Head: Normocephalic and atraumatic.     Right Ear: External ear normal.     Left Ear: External ear normal.     Nose: Nose normal.     Mouth/Throat:     Mouth: Mucous membranes are moist.  Eyes:      Extraocular Movements: Extraocular movements intact.  Cardiovascular:     Rate and Rhythm: Normal rate.     Pulses: Normal pulses.  Pulmonary:     Effort: Pulmonary effort is normal.  Abdominal:     General: Abdomen is flat. There is no distension.  Musculoskeletal:        General: Tenderness present.     Cervical back: Normal range of motion.     Comments: Patient rises from seated position to standing without difficulty. Good lumbar range of motion. No pain noted with facet loading. 5/5 strength noted with bilateral hip flexion, knee flexion/extension, ankle dorsiflexion/plantarflexion and EHL. No clonus noted bilaterally. No pain upon palpation of greater trochanters. No pain with internal/external rotation of bilateral hips. Sensation intact bilaterally. Myofascial tenderness noted upon palpation of bilateral lumbar paraspinal region. Negative slump test bilaterally. Ambulates without aid, gait steady.     Skin:    General: Skin is warm and dry.     Capillary Refill: Capillary refill takes less than 2 seconds.  Neurological:     General: No focal deficit present.     Mental Status: She is alert and oriented to person, place, and time.  Psychiatric:        Mood and Affect: Mood normal.        Behavior: Behavior normal.     Ortho Exam  Imaging: XR Lumbar Spine 2-3 Views Result Date: 12/15/2023 AP and lateral radiographs shows normal alignment and segmentation, well preserved disc spacing, no significant foraminal narrowing, no spondylolisthesis. No fractures or dislocations.    Past Medical/Family/Surgical/Social History: Medications & Allergies reviewed per EMR, new medications updated. Patient Active Problem List   Diagnosis Date Noted   Lumbar pain with radiation down both legs 11/28/2023   Class 1 obesity due to excess calories without serious comorbidity with body mass index (BMI) of 34.0 to 34.9 in adult 11/28/2023   Hand dermatitis 11/28/2023   Encounter for annual  health examination 07/24/2023   Encounter for Papanicolaou smear of cervix 07/24/2023   Influenza vaccination declined 07/24/2023   COVID-19 vaccination declined 07/24/2023   Class 1 obesity due to excess calories without serious comorbidity with body mass index (BMI) of 33.0 to 33.9 in adult 07/24/2023   Hot flashes 07/24/2023   Menorrhagia with regular cycle 02/08/2023   Acute bilateral low back pain without sciatica 02/08/2023   Establishing care with new doctor, encounter for 02/08/2023   History of TB (tuberculosis) 02/08/2023   Post term pregnancy over 40 weeks 07/28/2020   Encounter for health examination of refugee 06/08/2020   Hx of cesarean section complicating pregnancy 06/08/2020   Language barrier affecting health care 06/08/2020   TB lung, 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  obtaining treatment through [facility]--they come to her home every morning for observed treatment.   History reviewed. No pertinent family history. Past Surgical History:  Procedure Laterality Date   CESAREAN SECTION     Social History   Occupational History   Not on file  Tobacco Use   Smoking status: Never    Passive exposure: Never   Smokeless tobacco: Never  Vaping Use   Vaping status: Never Used  Substance and Sexual Activity   Alcohol use: Never   Drug use: Never   Sexual activity: Yes    Birth control/protection: None

## 2023-12-15 NOTE — Progress Notes (Signed)
 Lower back pain 2 years, worsening last 6 months  6/10  Burning pain Radiating to both legs No imaging done  No numbness or tingling

## 2024-01-02 ENCOUNTER — Ambulatory Visit
Admission: RE | Admit: 2024-01-02 | Discharge: 2024-01-02 | Disposition: A | Discharge status: Home / Self Care | Source: Ambulatory Visit | Attending: Physical Medicine and Rehabilitation | Admitting: Physical Medicine and Rehabilitation

## 2024-01-02 DIAGNOSIS — G8929 Other chronic pain: Secondary | ICD-10-CM

## 2024-01-02 DIAGNOSIS — M7918 Myalgia, other site: Secondary | ICD-10-CM

## 2024-01-02 DIAGNOSIS — M5416 Radiculopathy, lumbar region: Secondary | ICD-10-CM

## 2024-01-03 ENCOUNTER — Encounter: Payer: Self-pay | Admitting: Physical Therapy

## 2024-01-03 ENCOUNTER — Ambulatory Visit: Attending: Physical Medicine and Rehabilitation | Admitting: Physical Therapy

## 2024-01-03 ENCOUNTER — Other Ambulatory Visit: Payer: Self-pay

## 2024-01-03 DIAGNOSIS — M5459 Other low back pain: Secondary | ICD-10-CM | POA: Insufficient documentation

## 2024-01-03 DIAGNOSIS — M6281 Muscle weakness (generalized): Secondary | ICD-10-CM | POA: Diagnosis present

## 2024-01-03 DIAGNOSIS — G8929 Other chronic pain: Secondary | ICD-10-CM | POA: Diagnosis not present

## 2024-01-03 DIAGNOSIS — M5441 Lumbago with sciatica, right side: Secondary | ICD-10-CM | POA: Insufficient documentation

## 2024-01-03 DIAGNOSIS — M7918 Myalgia, other site: Secondary | ICD-10-CM | POA: Diagnosis not present

## 2024-01-03 DIAGNOSIS — M5442 Lumbago with sciatica, left side: Secondary | ICD-10-CM | POA: Insufficient documentation

## 2024-01-03 DIAGNOSIS — M5416 Radiculopathy, lumbar region: Secondary | ICD-10-CM | POA: Insufficient documentation

## 2024-01-03 NOTE — Therapy (Signed)
 OUTPATIENT PHYSICAL THERAPY THORACOLUMBAR EVALUATION   Patient Name: Jean Myers MRN: [id number] DOB:1995/06/10, 28 y.o., female Today's Date: 01/03/2024  END OF SESSION:  PT End of Session - 01/03/24 1529     Visit Number 1    Number of Visits 7    Date for PT Re-Evaluation 02/14/24    PT Start Time 1400    PT Stop Time 1440    PT Time Calculation (min) 40 min    Activity Tolerance Patient tolerated treatment well    Behavior During Therapy 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  obtaining treatment through [facility]--they come to her home every morning for observed treatment.   Past Surgical History:  Procedure Laterality Date   CESAREAN SECTION     Patient Active Problem List   Diagnosis Date Noted   Lumbar pain with radiation down both legs 11/28/2023   Class 1 obesity due to excess calories without serious comorbidity with body mass index (BMI) of 34.0 to 34.9 in adult 11/28/2023   Hand dermatitis 11/28/2023   Encounter for annual health examination 07/24/2023   Encounter for Papanicolaou smear of cervix 07/24/2023   Influenza vaccination declined 07/24/2023   COVID-19 vaccination declined 07/24/2023   Class 1 obesity due to excess calories without serious comorbidity with body mass index (BMI) of 33.0 to 33.9 in adult 07/24/2023   Hot flashes 07/24/2023   Menorrhagia with regular cycle 02/08/2023   Acute bilateral low back pain without sciatica 02/08/2023   Establishing care with new doctor, encounter for 02/08/2023   History of TB (tuberculosis) 02/08/2023   Post term pregnancy over 40 weeks 07/28/2020   Encounter for health examination of refugee 06/08/2020   Hx of cesarean section complicating pregnancy 06/08/2020   Language barrier affecting health care 06/08/2020   TB lung, 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 No  LIVING ENVIRONMENT: Lives with: lives with their family Lives in: House/apartment Stairs: Yes: Internal: 12 steps; can reach both Has following equipment at home: None  OCCUPATION: not currently working   PRECAUTIONS: None ---------------------------------------------------------------------------------------------  SUBJECTIVE:  SUBJECTIVE STATEMENT: Utilized in-person interpreter together subjective hx. Eval statement 01/03/2024: back pain onset for 3 years, has gotten worse over lat 6 months. Has no reports of n/t, however does describe pain going down B legs alongside back pain. Pain is worse in the morning, occasionally not being able to get out of bed. Gets worse with walking, medication can help alleviate. Pain currently 5/10 No swelling, give reports of no n/t, however verbalizes radicular symptoms throughout entire BLE   RED FLAGS: None    PLOF: Independent  PATIENT GOALS: stop the pain  NEXT MD VISIT: will call o schedule ---------------------------------------------------------------------------------------------  OBJECTIVE:  Note: Objective measures were completed at Evaluation unless otherwise noted.  DIAGNOSTIC FINDINGS:  Waiting on results from 01/02/2024 MRI.   PATIENT SURVEYS:  MODI: 16/50 (32%)   COGNITION: Overall  cognitive status: Within functional limits for tasks assessed   PALPATION: Tenderness to lumbar spine and   Lumbar contraction pattern  L Multifidus:good quality  R Multifidus:poor quality   SENSATION: Reports burning down BLE  MUSCLE LENGTH: Hamstrings: Right 15; deg; Left 15 deg  POSTURE: No Significant postural limitations and increased lumbar lordosis   LUMBAR ROM:   AROM eval  Flexion 80%  Extension 100%  Right lateral flexion 70%  Left lateral flexion 70%  Right rotation 90%  Left rotation 90%   (Blank rows = not tested)  ! Indicates pain with testing  LOWER EXTREMITY ROM:     Active  Right eval Left eval  Hip flexion    Hip extension    Hip abduction    Hip adduction    Hip internal rotation    Hip external rotation    Knee flexion    Knee extension    Ankle dorsiflexion    Ankle plantarflexion    Ankle inversion    Ankle eversion     (Blank rows = not tested)  ! Indicates pain with testing  LOWER EXTREMITY MMT:    MMT Right eval Left eval  Hip flexion 4 4  Hip extension 4+ 4-  Hip abduction 4 4  Hip adduction    Hip internal rotation    Hip external rotation    Knee flexion    Knee extension    Ankle dorsiflexion    Ankle plantarflexion    Ankle inversion    Ankle eversion     (Blank rows = not tested)   ! Indicates pain with testing LUMBAR SPECIAL TESTS:  Prone instability test: Positive Slump: Positive  GAIT: Distance walked: 130ft Assistive device utilized: None Level of assistance: Complete Independence Comments: [facility]  [facility] Adult PT Treatment:                                                DATE: 01/03/2024 Self Care: Pt education POC discussion  PATIENT EDUCATION:  Education details: Pt received education regarding HEP performance, ADL performance, functional activity tolerance, impairment  education, appropriate performance of therapeutic activities.  Person educated: Patient Education method: Explanation, Demonstration, Tactile cues, Verbal cues, and Handouts Education comprehension: verbalized understanding and returned demonstration  HOME EXERCISE PROGRAM: Access Code: [email or web address] URL: [email or web address] Date: 01/03/2024 Prepared by: Mabel Kiang  Exercises - Seated Hamstring Stretch  - 1 x daily - 7 x weekly - 2 sets - 1 reps - 70m hold - Supine Quadratus Lumborum Stretch  - 1 x daily - 7 x weekly - 2 sets - 1 reps - 48m hold - Supine Posterior Pelvic Tilt  - 1 x daily - 7 x weekly - 2-3 sets - 12 reps - 3s hold - Supine 90/90 Abdominal Bracing  - 1 x daily - 7 x weekly - 2-3 sets - 2 reps - 30s hold - Supine Bridge  - 1 x daily - 4 x weekly - 2-3 sets - 12 reps - 4s hold ---------------------------------------------------------------------------------------------  ASSESSMENT:  CLINICAL IMPRESSION: Eval impression (01/03/2024): Pt. attended today's physical therapy session for evaluation of Low back pain. Pt has complaints of midline LBP with occasional B burning down BLE accompanied by worsening back pain. Pt has notable deficits and would benefit from therapeutic focus on lumbar stability, lumbar ROM,  lumbo pelvic strengthening, sciatic motility, and activity tolerance. Treatment performed today focused on pt education detailed in the objective. Pt demonstrated good understanding of education provided. required minimal v/t cues and no assistance for appropriate performance with today's activities. Pt requires the intervention of skilled outpatient physical therapy to address the aforementioned deficits and progress towards a functional level in line with therapeutic goals.    OBJECTIVE IMPAIRMENTS: decreased ROM, decreased strength, improper body mechanics, and pain.   ACTIVITY LIMITATIONS: squatting, stairs, and locomotion level  PARTICIPATION  LIMITATIONS: driving, shopping, and community activity  PERSONAL FACTORS: Fitness, Past/current experiences, Time since onset of injury/illness/exacerbation, and language barriers are also affecting patient's functional outcome.   REHAB POTENTIAL: Good  CLINICAL DECISION MAKING: Stable/uncomplicated  EVALUATION COMPLEXITY: Low   GOALS: Goals reviewed with patient? YES  SHORT TERM GOALS: Target date: 01/24/2024  Pt will be independent with administered HEP to demonstrate the competency necessary for long term managemnet of symptoms at home. Baseline: Goal status: INITIAL   LONG TERM GOALS: Target date: 02/14/2024  Pt. Will achieve a MODI score of 10/50 (20%) as to demonstrate improvement in self-perceived functional ability with daily activities.  Baseline: 16/50 (32%) Goal status: INITIAL  2.  Pt will improve Global hip strength to a 4+/5 to demonstrate improvement in strength for quality of motion and activity performance. Baseline:  Goal status: INITIAL  3.  Pt will improve Lumbar AROM to 90% of standardized norms with less than 2/10 pain to demonstrate necessary mobility for high quality and safe ADLs  Baseline: see objective chart, 5/10 Goal status: INITIAL  4.   Pt will reports independently ambulating 1 hour  with no AD and less than 3/10 pain to demonstrate improved weightbearing tolerance, BLE strength, and functional capacity for community ambulation.  Baseline:  Goal status: INITIAL  ---------------------------------------------------------------------------------------------  PLAN:  PT FREQUENCY: 1-2x/week  PT DURATION: 6 weeks  PLANNED INTERVENTIONS: 97110-Therapeutic exercises, 97530- Therapeutic activity, 97112- Neuromuscular re-education, 97535- Self Care, 02859- Manual therapy, [phone]- Gait training, Patient/Family education, Balance training, Stair training, Taping, Joint mobilization, Spinal mobilization, Cryotherapy, and Moist heat.  PLAN FOR NEXT  SESSION: Review HEP, Begin POC as detailed in  assessment   Mabel Kiang, PT, DPT 01/03/2024, 3:29 PM    For all possible CPT codes, reference the Planned Interventions line above.     Check all conditions that are expected to impact treatment: {Conditions expected to impact treatment:None of these apply   If treatment provided at initial evaluation, no treatment charged due to lack of authorization.

## 2024-01-12 ENCOUNTER — Ambulatory Visit

## 2024-01-12 DIAGNOSIS — M6281 Muscle weakness (generalized): Secondary | ICD-10-CM

## 2024-01-12 DIAGNOSIS — M5459 Other low back pain: Secondary | ICD-10-CM

## 2024-01-12 NOTE — Therapy (Signed)
 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  obtaining treatment through [facility]--they come to her home every morning for observed treatment.   Past Surgical History:  Procedure Laterality Date   CESAREAN SECTION     Patient Active Problem List   Diagnosis Date Noted   Lumbar pain with radiation down both legs 11/28/2023   Class 1 obesity due to excess calories without serious comorbidity with body mass index (BMI) of 34.0 to 34.9 in adult 11/28/2023   Hand dermatitis 11/28/2023   Encounter for annual health examination 07/24/2023   Encounter for Papanicolaou smear of cervix 07/24/2023   Influenza vaccination declined 07/24/2023   COVID-19 vaccination declined 07/24/2023   Class 1 obesity due to excess calories without serious comorbidity with body mass index (BMI) of 33.0 to 33.9 in adult 07/24/2023   Hot flashes 07/24/2023   Menorrhagia with regular cycle 02/08/2023   Acute bilateral low back pain without sciatica 02/08/2023   Establishing care with new doctor, encounter for 02/08/2023   History of TB (tuberculosis) 02/08/2023   Post term pregnancy over 40 weeks 07/28/2020   Encounter for health examination of refugee 06/08/2020   Hx of cesarean section complicating pregnancy 06/08/2020   Language barrier affecting health care 06/08/2020   TB lung, 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 No  LIVING ENVIRONMENT: Lives with: lives with their family Lives in: House/apartment Stairs: Yes: Internal: 12 steps; can reach both Has following equipment at home: None  OCCUPATION: not currently working   PRECAUTIONS: None ---------------------------------------------------------------------------------------------  SUBJECTIVE:  SUBJECTIVE STATEMENT: Utilized video interpreter: Patient reports continued back pain.   Eval statement 01/03/2024: back pain onset for 3 years, has gotten worse over lat 6 months. Has no reports of n/t, however does describe pain going down B legs alongside back pain. Pain is worse in the morning, occasionally not being able to get out of bed. Gets worse with walking, medication can help alleviate. Pain currently 5/10 No swelling, give reports of no n/t, however verbalizes radicular symptoms throughout entire BLE   RED FLAGS: None    PLOF: Independent  PATIENT GOALS: stop the pain  NEXT MD VISIT: will call o schedule ---------------------------------------------------------------------------------------------  OBJECTIVE:  Note: Objective measures were completed at Evaluation unless otherwise noted.  DIAGNOSTIC FINDINGS:  Waiting on results from 01/02/2024 MRI.   PATIENT SURVEYS:  MODI: 16/50 (32%)    COGNITION: Overall cognitive status: Within functional limits for tasks assessed   PALPATION: Tenderness to lumbar spine and   Lumbar contraction pattern  L Multifidus:good quality  R Multifidus:poor quality   SENSATION: Reports burning down BLE  MUSCLE LENGTH: Hamstrings: Right 15; deg; Left 15 deg  POSTURE: No Significant postural limitations and increased lumbar lordosis   LUMBAR ROM:   AROM eval  Flexion 80%  Extension 100%  Right lateral flexion 70%  Left lateral flexion 70%  Right rotation 90%  Left rotation 90%   (Blank rows = not tested)  ! Indicates pain with testing  LOWER EXTREMITY ROM:     Active  Right eval Left eval  Hip flexion    Hip extension    Hip abduction    Hip adduction    Hip internal rotation    Hip external rotation    Knee flexion    Knee extension    Ankle dorsiflexion    Ankle plantarflexion    Ankle inversion    Ankle eversion     (Blank rows = not tested)  ! Indicates pain with testing  LOWER EXTREMITY MMT:    MMT Right eval Left eval  Hip flexion 4 4  Hip extension 4+ 4-  Hip abduction 4 4  Hip adduction    Hip internal rotation    Hip external rotation    Knee flexion    Knee extension    Ankle dorsiflexion    Ankle plantarflexion    Ankle inversion    Ankle eversion     (Blank rows = not tested)   ! Indicates pain with testing LUMBAR SPECIAL TESTS:  Prone instability test: Positive Slump: Positive  GAIT: Distance walked: 128ft Assistive device utilized: None Level of assistance: Complete Independence Comments: W[facility]  [facility] Adult PT Treatment:                                                DATE: 01/12/24 Therapeutic Exercise: Nustep level 5 x 8 mins while gathering subjective info and planning session with patient Seated hamstring stretch 2x30 BIL Supine SLR 2x10 BIL Supine QL stretch 2x30 BIL LTR 2x10 BIL Neuromuscular re-ed: Bridges 2x10 Supine 90/90 isometric hold 2x30 Supine sciatic nerve  glide x20 BIL Prone press ups x10  [facility] Adult PT Treatment:  DATE: 01/03/2024 Self Care: Pt education POC discussion                                                                                                                               PATIENT EDUCATION:  Education details: Pt received education regarding HEP performance, ADL performance, functional activity tolerance, impairment education, appropriate performance of therapeutic activities.  Person educated: Patient Education method: Explanation, Demonstration, Tactile cues, Verbal cues, and Handouts Education comprehension: verbalized understanding and returned demonstration  HOME EXERCISE PROGRAM: Access Code: [email or web address] URL: [email or web address] Date: 01/03/2024 Prepared by: Mabel Kiang  Exercises - Seated Hamstring Stretch  - 1 x daily - 7 x weekly - 2 sets - 1 reps - 85m hold - Supine Quadratus Lumborum Stretch  - 1 x daily - 7 x weekly - 2 sets - 1 reps - 64m hold - Supine Posterior Pelvic Tilt  - 1 x daily - 7 x weekly - 2-3 sets - 12 reps - 3s hold - Supine 90/90 Abdominal Bracing  - 1 x daily - 7 x weekly - 2-3 sets - 2 reps - 30s hold - Supine Bridge  - 1 x daily - 4 x weekly - 2-3 sets - 12 reps - 4s hold ---------------------------------------------------------------------------------------------  ASSESSMENT:  CLINICAL IMPRESSION: Patient presents to first follow up PT session reporting continued back pain, has been compliant with HEP. Session today focused on core and proximal hip strengthening as well as lumbar mobility. Patient was able to tolerate all prescribed exercises with no adverse effects. Patient continues to benefit from skilled PT services and should be progressed as able to improve functional independence.   Eval impression (01/03/2024): Pt. attended today's physical therapy session for evaluation of Low back pain. Pt has complaints of midline  LBP with occasional B burning down BLE accompanied by worsening back pain. Pt has notable deficits and would benefit from therapeutic focus on lumbar stability, lumbar ROM,  lumbo pelvic strengthening, sciatic motility, and activity tolerance. Treatment performed today focused on pt education detailed in the objective. Pt demonstrated good understanding of education provided. required minimal v/t cues and no assistance for appropriate performance with today's activities. Pt requires the intervention of skilled outpatient physical therapy to address the aforementioned deficits and progress towards a functional level in line with therapeutic goals.    OBJECTIVE IMPAIRMENTS: decreased ROM, decreased strength, improper body mechanics, and pain.   ACTIVITY LIMITATIONS: squatting, stairs, and locomotion level  PARTICIPATION LIMITATIONS: driving, shopping, and community activity  PERSONAL FACTORS: Fitness, Past/current experiences, Time since onset of injury/illness/exacerbation, and language barriers are also affecting patient's functional outcome.   REHAB POTENTIAL: Good  CLINICAL DECISION MAKING: Stable/uncomplicated  EVALUATION COMPLEXITY: Low   GOALS: Goals reviewed with patient? YES  SHORT TERM GOALS: Target date: 01/24/2024  Pt will be independent with administered HEP to demonstrate the competency necessary for long term managemnet of symptoms at home. Baseline: Goal status: INITIAL   LONG TERM GOALS: Target  date: 02/14/2024  Pt. Will achieve a MODI score of 10/50 (20%) as to demonstrate improvement in self-perceived functional ability with daily activities.  Baseline: 16/50 (32%) Goal status: INITIAL  2.  Pt will improve Global hip strength to a 4+/5 to demonstrate improvement in strength for quality of motion and activity performance. Baseline:  Goal status: INITIAL  3.  Pt will improve Lumbar AROM to 90% of standardized norms with less than 2/10 pain to demonstrate  necessary mobility for high quality and safe ADLs  Baseline: see objective chart, 5/10 Goal status: INITIAL  4.   Pt will reports independently ambulating 1 hour  with no AD and less than 3/10 pain to demonstrate improved weightbearing tolerance, BLE strength, and functional capacity for community ambulation.  Baseline:  Goal status: INITIAL  ---------------------------------------------------------------------------------------------  PLAN:  PT FREQUENCY: 1-2x/week  PT DURATION: 6 weeks  PLANNED INTERVENTIONS: 97110-Therapeutic exercises, 97530- Therapeutic activity, 97112- Neuromuscular re-education, 97535- Self Care, 02859- Manual therapy, [phone]- Gait training, Patient/Family education, Balance training, Stair training, Taping, Joint mobilization, Spinal mobilization, Cryotherapy, and Moist heat.  PLAN FOR NEXT SESSION: Review HEP, Begin POC as detailed in assessment   Corean Pouch PTA  01/12/2024, 12:09 PM    For all possible CPT codes, reference the Planned Interventions line above.     Check all conditions that are expected to impact treatment: {Conditions expected to impact treatment:None of these apply   If treatment provided at initial evaluation, no treatment charged due to lack of authorization.

## 2024-01-13 ENCOUNTER — Ambulatory Visit: Admitting: Physical Medicine and Rehabilitation

## 2024-01-13 ENCOUNTER — Encounter: Payer: Self-pay | Admitting: Physical Medicine and Rehabilitation

## 2024-01-13 DIAGNOSIS — M5442 Lumbago with sciatica, left side: Secondary | ICD-10-CM

## 2024-01-13 DIAGNOSIS — G8929 Other chronic pain: Secondary | ICD-10-CM | POA: Diagnosis not present

## 2024-01-13 DIAGNOSIS — M7918 Myalgia, other site: Secondary | ICD-10-CM | POA: Diagnosis not present

## 2024-01-13 DIAGNOSIS — M5441 Lumbago with sciatica, right side: Secondary | ICD-10-CM

## 2024-01-13 NOTE — Progress Notes (Signed)
 Jean Myers - 28 y.o. female MRN [id number]  Date of birth: [birth year]  Office Visit Note: Visit Date: 01/13/2024 PCP: Georgina Speaks, FNP Referred by: Georgina Speaks, FNP  Subjective: Chief Complaint  Patient presents with   Lower Back - Pain   HPI: Jean Myers is a 28 y.o. female who comes in today for evaluation of chronic, worsening and severe bilateral lower back pain radiating to legs. Dari interpreter at bedside. Pain ongoing for over 2 years, states her pain worsened after receiving epidural for childbirth. She describes her pain as severe and bone burning sensation, currently rates as 8 out of 10. Her pain becomes severe with standing and walking. Some relief of pain with home exercise regimen, rest and use of medications. Some relief of pain with Meloxicam . Recent lumbar MRI imaging shows slight disc desiccation facet arthrosis in the lower lumbar spine. There is no focal protrusion, foraminal or spinal stenosis. No acute abnormality. Patient denies focal weakness, numbness and tingling. No recent trauma or falls.       Review of Systems  Musculoskeletal:  Positive for back pain and myalgias.  Neurological:  Negative for tingling, sensory change, focal weakness and weakness.  All other systems reviewed and are negative.  Otherwise per HPI.  Assessment & Plan: Visit Diagnoses: No diagnosis found.   Plan: Findings:  Chronic, worsening and severe bilateral lower back pain radiating to legs. Patient continues to have pain despite good conservative therapies such as home exercise regimen, rest and use of medications. I discussed recent lumbar MRI with patient today using imaging and spine model. Overall, lumbar MRI imaging looks fairly normal for her age. No severe nerve impingement. No severe spinal canal stenosis. Her pain pattern is more non dermatomal and does not directly correlate with MRI imaging. I do not feel her pain is being caused by her spine. We discussed treatment  plan in detail today. I would like for her to continue with formal physical therapy. I also feel there could be a chronic pain/central sensitization syndrome working to exacerbate her pain. She can talk to her PCP for further management. I encouraged patient to remain active as tolerated and to continue with home exercise regimen. No red flag symptoms noted upon exam today.     Meds & Orders: No orders of the defined types were placed in this encounter.  No orders of the defined types were placed in this encounter.   Follow-up: Return if symptoms worsen or fail to improve.   Procedures: No procedures performed      Clinical History: MR LUMBAR SPINE WITHOUT IV CONTRAST   COMPARISON: X-ray 12/15/2023   CLINICAL HISTORY: Low back pain   TECHNIQUE: SAG T2, SAG T1, SAG STIR, AX T2, AX T1 without IV contrast.   FINDINGS: There is normal alignment of the lumbar spine. There is no vertebral body height loss, subluxation or marrow replacing process. The sacrum and SI joints are unremarkable so far as visualized. Conus and cauda equina are unremarkable.   T12-L1: There is no focal disc protrusion, foraminal or spinal stenosis.   L1-2: There is no focal disc protrusion, foraminal or spinal stenosis.   L2-3: There is no focal disc protrusion, foraminal or spinal stenosis.   L3-4: There is no focal disc protrusion, foraminal or spinal stenosis. Mild facet arthrosis.   L4-5: There is no focal disc protrusion, foraminal or spinal stenosis. Mild-to-moderate facet arthrosis.   L5-S1: Mild disc desiccation without focal protrusion, foraminal or spinal stenosis. Mild-to-moderate facet  arthrosis.   The retroperitoneal structures demonstrate no significant abnormality.   IMPRESSION: Slight disc desiccation facet arthrosis in the lower lumbar spine. There is no focal protrusion, foraminal or spinal stenosis. No acute abnormality.   Electronically signed by: Norleen Satchel MD 01/03/2024 02:59  PM EDT RP Workstation: [id number]   She reports that she has never smoked. She has never been exposed to tobacco smoke. She has never used smokeless tobacco.  Recent Labs    07/14/23 1058  HGBA1C 5.4    Objective:  VS:  HT:    WT:   BMI:     BP:   HR: bpm  TEMP: ( )  RESP:  Physical Exam Vitals and nursing note reviewed.  HENT:     Head: Normocephalic and atraumatic.     Right Ear: External ear normal.     Left Ear: External ear normal.     Nose: Nose normal.     Mouth/Throat:     Mouth: Mucous membranes are moist.  Eyes:     Extraocular Movements: Extraocular movements intact.  Cardiovascular:     Rate and Rhythm: Normal rate.     Pulses: Normal pulses.  Pulmonary:     Effort: Pulmonary effort is normal.  Abdominal:     General: Abdomen is flat. There is no distension.  Musculoskeletal:        General: Tenderness present.     Cervical back: Normal range of motion.     Comments: Patient rises from seated position to standing without difficulty. Good lumbar range of motion. No pain noted with facet loading. 5/5 strength noted with bilateral hip flexion, knee flexion/extension, ankle dorsiflexion/plantarflexion and EHL. No clonus noted bilaterally. No pain upon palpation of greater trochanters. No pain with internal/external rotation of bilateral hips. Sensation intact bilaterally. Myofascial tenderness noted upon palpation of bilateral lumbar paraspinal regions. Negative slump test bilaterally. Ambulates without aid, gait steady.     Skin:    General: Skin is warm and dry.     Capillary Refill: Capillary refill takes less than 2 seconds.  Neurological:     General: No focal deficit present.     Mental Status: She is alert and oriented to person, place, and time.  Psychiatric:        Mood and Affect: Mood normal.        Behavior: Behavior normal.     Ortho Exam  Imaging: No results found.  Past Medical/Family/Surgical/Social History: Medications & Allergies  reviewed per EMR, new medications updated. Patient Active Problem List   Diagnosis Date Noted   Lumbar pain with radiation down both legs 11/28/2023   Class 1 obesity due to excess calories without serious comorbidity with body mass index (BMI) of 34.0 to 34.9 in adult 11/28/2023   Hand dermatitis 11/28/2023   Encounter for annual health examination 07/24/2023   Encounter for Papanicolaou smear of cervix 07/24/2023   Influenza vaccination declined 07/24/2023   COVID-19 vaccination declined 07/24/2023   Class 1 obesity due to excess calories without serious comorbidity with body mass index (BMI) of 33.0 to 33.9 in adult 07/24/2023   Hot flashes 07/24/2023   Menorrhagia with regular cycle 02/08/2023   Acute bilateral low back pain without sciatica 02/08/2023   Establishing care with new doctor, encounter for 02/08/2023   History of TB (tuberculosis) 02/08/2023   Post term pregnancy over 40 weeks 07/28/2020   Encounter for health examination of refugee 06/08/2020   Hx of cesarean section complicating pregnancy 06/08/2020   Language  barrier affecting health care 06/08/2020   TB lung, 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  obtaining treatment through [facility]--they come to her home every morning for observed treatment.   No family history on file. Past Surgical History:  Procedure Laterality Date   CESAREAN SECTION     Social History   Occupational History   Not on file  Tobacco Use   Smoking status: Never    Passive exposure: Never   Smokeless tobacco: Never  Vaping Use   Vaping status: Never Used  Substance and Sexual Activity   Alcohol use: Never   Drug use: Never   Sexual activity: Yes    Birth control/protection: None

## 2024-01-13 NOTE — Progress Notes (Signed)
 Pain Scale   Average Pain 8   MRI review for LBP that goes into both legs.      +Driver, -BT, -Dye Allergies.

## 2024-01-16 ENCOUNTER — Encounter: Payer: Self-pay | Admitting: Physical Medicine and Rehabilitation

## 2024-01-17 ENCOUNTER — Ambulatory Visit: Admitting: Physical Therapy

## 2024-01-17 NOTE — Therapy (Incomplete)
 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  obtaining treatment through [facility]--they come to her home every morning for observed treatment.   Past Surgical History:  Procedure Laterality Date   CESAREAN SECTION     Patient Active Problem List   Diagnosis Date Noted   Lumbar pain with radiation down both legs 11/28/2023   Class 1 obesity due to excess calories without serious comorbidity with body mass index (BMI) of 34.0 to 34.9 in adult 11/28/2023   Hand dermatitis 11/28/2023   Encounter for annual health examination 07/24/2023   Encounter for Papanicolaou smear of cervix 07/24/2023   Influenza vaccination declined 07/24/2023   COVID-19 vaccination declined 07/24/2023   Class 1 obesity due to excess calories without serious comorbidity with body mass index (BMI) of 33.0 to 33.9 in adult 07/24/2023   Hot flashes 07/24/2023   Menorrhagia with regular cycle 02/08/2023   Acute bilateral low back pain without sciatica 02/08/2023   Establishing care with new doctor, encounter for 02/08/2023   History of TB (tuberculosis) 02/08/2023   Post term pregnancy over 40 weeks 07/28/2020   Encounter for health examination of refugee 06/08/2020   Hx of cesarean section complicating pregnancy 06/08/2020   Language barrier affecting health care 06/08/2020   TB lung, 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  PERTINENT  HISTORY: TB,   WEIGHT BEARING RESTRICTIONS: No  FALLS:  Has patient fallen in last 6 months? No  LIVING ENVIRONMENT: Lives with: lives with their family Lives in: House/apartment Stairs: Yes: Internal: 12 steps; can reach both Has following equipment at home: None  OCCUPATION: not currently working   PRECAUTIONS: None ---------------------------------------------------------------------------------------------  SUBJECTIVE:                                                                                                                                                                                           SUBJECTIVE STATEMENT: Utilized video interpreter: Patient reports continued back pain.   Eval statement 01/03/2024: back pain onset for 3 years, has gotten worse over lat 6 months. Has no reports of n/t, however does  describe pain going down B legs alongside back pain. Pain is worse in the morning, occasionally not being able to get out of bed. Gets worse with walking, medication can help alleviate. Pain currently 5/10 No swelling, give reports of no n/t, however verbalizes radicular symptoms throughout entire BLE   RED FLAGS: None    PLOF: Independent  PATIENT GOALS: stop the pain  NEXT MD VISIT: will call o schedule ---------------------------------------------------------------------------------------------  OBJECTIVE:  Note: Objective measures were completed at Evaluation unless otherwise noted.  DIAGNOSTIC FINDINGS:  Waiting on results from 01/02/2024 MRI.   PATIENT SURVEYS:  MODI: 16/50 (32%)   COGNITION: Overall cognitive status: Within functional limits for tasks assessed   PALPATION: Tenderness to lumbar spine and   Lumbar contraction pattern  L Multifidus:good quality  R Multifidus:poor quality   SENSATION: Reports burning down BLE  MUSCLE LENGTH: Hamstrings: Right 15; deg; Left 15 deg  POSTURE: No Significant postural limitations and increased  lumbar lordosis   LUMBAR ROM:   AROM eval  Flexion 80%  Extension 100%  Right lateral flexion 70%  Left lateral flexion 70%  Right rotation 90%  Left rotation 90%   (Blank rows = not tested)  ! Indicates pain with testing  LOWER EXTREMITY ROM:     Active  Right eval Left eval  Hip flexion    Hip extension    Hip abduction    Hip adduction    Hip internal rotation    Hip external rotation    Knee flexion    Knee extension    Ankle dorsiflexion    Ankle plantarflexion    Ankle inversion    Ankle eversion     (Blank rows = not tested)  ! Indicates pain with testing  LOWER EXTREMITY MMT:    MMT Right eval Left eval  Hip flexion 4 4  Hip extension 4+ 4-  Hip abduction 4 4  Hip adduction    Hip internal rotation    Hip external rotation    Knee flexion    Knee extension    Ankle dorsiflexion    Ankle plantarflexion    Ankle inversion    Ankle eversion     (Blank rows = not tested)   ! Indicates pain with testing LUMBAR SPECIAL TESTS:  Prone instability test: Positive Slump: Positive  GAIT: Distance walked: 181ft Assistive device utilized: None Level of assistance: Complete Independence Comments: WFL  [facility] Adult PT Treatment:                                                DATE: 01/17/2024  Therapeutic Exercise: *** Manual Therapy: *** Neuromuscular re-ed: *** Therapeutic Activity: *** Modalities: *** Self Care: ***   [facility] Myers Adult PT Treatment:                                                DATE: 01/12/24 Therapeutic Exercise: Nustep level 5 x 8 mins while gathering subjective info and planning session with patient Seated hamstring stretch 2x30 BIL Supine SLR 2x10 BIL Supine QL stretch 2x30 BIL LTR 2x10 BIL Neuromuscular re-ed: Bridges 2x10 Supine 90/90 isometric hold 2x30 Supine sciatic nerve glide x20 BIL Prone press ups x10  [facility] Adult PT Treatment:  DATE: 01/03/2024 Self Care: Pt  education POC discussion                                                                                                                               PATIENT EDUCATION:  Education details: Pt received education regarding HEP performance, ADL performance, functional activity tolerance, impairment education, appropriate performance of therapeutic activities.  Person educated: Patient Education method: Explanation, Demonstration, Tactile cues, Verbal cues, and Handouts Education comprehension: verbalized understanding and returned demonstration  HOME EXERCISE PROGRAM: Access Code: [email or web address] URL: [email or web address] Date: 01/03/2024 Prepared by: Mabel Kiang  Exercises - Seated Hamstring Stretch  - 1 x daily - 7 x weekly - 2 sets - 1 reps - 65m hold - Supine Quadratus Lumborum Stretch  - 1 x daily - 7 x weekly - 2 sets - 1 reps - 47m hold - Supine Posterior Pelvic Tilt  - 1 x daily - 7 x weekly - 2-3 sets - 12 reps - 3s hold - Supine 90/90 Abdominal Bracing  - 1 x daily - 7 x weekly - 2-3 sets - 2 reps - 30s hold - Supine Bridge  - 1 x daily - 4 x weekly - 2-3 sets - 12 reps - 4s hold ---------------------------------------------------------------------------------------------  ASSESSMENT:  CLINICAL IMPRESSION: Patient presents to first follow up PT session reporting continued back pain, has been compliant with HEP. Session today focused on core and proximal hip strengthening as well as lumbar mobility. Patient was able to tolerate all prescribed exercises with no adverse effects. Patient continues to benefit from skilled PT services and should be progressed as able to improve functional independence.   Eval impression (01/03/2024): Pt. attended today's physical therapy session for evaluation of Low back pain. Pt has complaints of midline LBP with occasional B burning down BLE accompanied by worsening back pain. Pt has notable deficits and would benefit from therapeutic focus  on lumbar stability, lumbar ROM,  lumbo pelvic strengthening, sciatic motility, and activity tolerance. Treatment performed today focused on pt education detailed in the objective. Pt demonstrated good understanding of education provided. required minimal v/t cues and no assistance for appropriate performance with today's activities. Pt requires the intervention of skilled outpatient physical therapy to address the aforementioned deficits and progress towards a functional level in line with therapeutic goals.    OBJECTIVE IMPAIRMENTS: decreased ROM, decreased strength, improper body mechanics, and pain.   ACTIVITY LIMITATIONS: squatting, stairs, and locomotion level  PARTICIPATION LIMITATIONS: driving, shopping, and community activity  PERSONAL FACTORS: Fitness, Past/current experiences, Time since onset of injury/illness/exacerbation, and language barriers are also affecting patient's functional outcome.   REHAB POTENTIAL: Good  CLINICAL DECISION MAKING: Stable/uncomplicated  EVALUATION COMPLEXITY: Low   GOALS: Goals reviewed with patient? YES  SHORT TERM GOALS: Target date: 01/24/2024  Pt will be independent with administered HEP to demonstrate the competency necessary for long term managemnet of symptoms at home. Baseline: Goal status: INITIAL   LONG TERM GOALS:  Target date: 02/14/2024  Pt. Will achieve a MODI score of 10/50 (20%) as to demonstrate improvement in self-perceived functional ability with daily activities.  Baseline: 16/50 (32%) Goal status: INITIAL  2.  Pt will improve Global hip strength to a 4+/5 to demonstrate improvement in strength for quality of motion and activity performance. Baseline:  Goal status: INITIAL  3.  Pt will improve Lumbar AROM to 90% of standardized norms with less than 2/10 pain to demonstrate necessary mobility for high quality and safe ADLs  Baseline: see objective chart, 5/10 Goal status: INITIAL  4.   Pt will reports independently  ambulating 1 hour  with no AD and less than 3/10 pain to demonstrate improved weightbearing tolerance, BLE strength, and functional capacity for community ambulation.  Baseline:  Goal status: INITIAL  ---------------------------------------------------------------------------------------------  PLAN:  PT FREQUENCY: 1-2x/week  PT DURATION: 6 weeks  PLANNED INTERVENTIONS: 97110-Therapeutic exercises, 97530- Therapeutic activity, 97112- Neuromuscular re-education, 97535- Self Care, 02859- Manual therapy, [id number]- Gait training, Patient/Family education, Balance training, Stair training, Taping, Joint mobilization, Spinal mobilization, Cryotherapy, and Moist heat.  PLAN FOR NEXT SESSION: Progress as indicated within current POC   Mabel JONELLE Kiang PT  01/17/2024, 1:58 PM    For all possible CPT codes, reference the Planned Interventions line above.     Check all conditions that are expected to impact treatment: {Conditions expected to impact treatment:None of these apply   If treatment provided at initial evaluation, no treatment charged due to lack of authorization.

## 2024-01-23 ENCOUNTER — Encounter: Payer: Self-pay | Admitting: Family

## 2024-01-23 ENCOUNTER — Telehealth: Payer: Self-pay

## 2024-01-23 ENCOUNTER — Ambulatory Visit (INDEPENDENT_AMBULATORY_CARE_PROVIDER_SITE_OTHER): Admitting: Family

## 2024-01-23 DIAGNOSIS — L309 Dermatitis, unspecified: Secondary | ICD-10-CM

## 2024-01-23 NOTE — Progress Notes (Signed)
 Follow-up Note  RE: Jean Myers MRN: [id number]  DOB: Jan 26, 1996 Date of Office Visit: 01/23/24  Primary care provider: Georgina Speaks, FNP Referring provider: Georgina Speaks, FNP   Jean Myers returns to the office today for the patch test placement, given suspected history of contact dermatitis.    Diagnostics: NAC 80 patches placed NAC-80 (1-80)   1. Ammonium persulfate  2. [place] Balsam  3. Omitted  4. 4-tert-Butylphenolformaldehyde resin (PTBP)  5. Bacitracin  6. Budesonide  7. Quaternium-15  8. Cinnamal  9. Cobalt(II) chloride hexahydrate  10. Colophonium  11. Methyldibromo glutaronitrile  12. Decyl Glucoside  13. Ethylenediamine dihydrochloride  14. 2-Hydroxyethyl methacrylate  15. Hydroperoxides of Linalool  16. Iodopropynyl butylcarbamate  17. 2-Mercaptobenzothiazole (MBT)  18. Thiuram mix  19. METHYLISOTHIAZOLINONE  20. Propylene glycol  21. 1,3-Diphenylguanidine  22. Hydroperoxides of Limonene  23. Black rubber mix  24. Carba mix  25. Fragrance mix I  26. Fragrance mix II  27. Textile dye mix II  28. Neomycin sulfate  29. Nickel(II) sulfate hexahydrate  30. p-Phenylenediamine (PPD)  31. Potassium dichromate  32. Propolis  33. Sodium Metabisulfite  34. Tixocortol-21-pivalate  35. Lanolin alcohol  36. Methylisothiazolinone + Methylchloroisothiazolinone  37. Cocamidopropyl betaine  38. 3-(Dimethylamino)-1-propylamine  39. Formaldehyde  40. Oleamidopropyl dimethylamine  41. 2-Bromo-2-Nitropropane-l,3-diol  42. Diazolidinyl urea  43. DMDM Hydantoin  44. Epoxy resin, Bisphenol A  45. Benzophenone-4  46. Imidazolidinyl urea  47. Lauryl polyglucose  48 Methyl methacrylate  49. Paraben mix  50. Mercapto mix  51. Caine mix III  52. Mixed dialkyl thiourea  53. Compositae mix II  54. Toluenesulfonamide formaldehyde resin  55. Tea Tree Oil oxidized  56. Ylang-Ylang oil  57. Amidoamine  58. Amerchol L 101  59. Benzocaine   60. Benzyl alchohol  61.  Benzyl salicylate  62. Chloroxylenol (PCMX)  63. Cocamide DEA  64. Clobetasol -17-propionate  65. Toluene-2,5-Diamine sulfate  66. Ethyl acrylate  67. N-Isopropyl-N-phenyl--4-phenylenediamine (IPPD)  68. Lidocaine   69. Omitted  70. Sesquiterpene lactone mix  71. 2-n-Octyl-4-isothiazolin-3-one  72. Propyl gallate  73. Polymyxin B sulfate  74. Pramoxine hydrochloride  75. Sodium benzoate  76. Sorbitan oleate  77. Sorbitan sesquioleate  78. Tocopherol  79. BENZALKONIUM CHLORIDE  80. Chlorhexidine digluconate    Allergic contact dermatitis - Instructions provided on care of the patches for the next 48 hours. Jean Myers was instructed to avoid showering for the next 48 hours. Jean Myers will follow up in 48 hours and 96 hours for patch readings.   Wanda Craze, FNP [facility] 

## 2024-01-23 NOTE — Telephone Encounter (Signed)
 Utilized interpreter to inform patient of need to cancel appointment on 9/30. Patient confirmed understanding and next appointment time was confirmed.  Corean Pouch, PTA 01/23/24 9:39 AM

## 2024-01-24 ENCOUNTER — Ambulatory Visit

## 2024-01-25 ENCOUNTER — Encounter: Payer: Self-pay | Admitting: Family

## 2024-01-25 ENCOUNTER — Ambulatory Visit (INDEPENDENT_AMBULATORY_CARE_PROVIDER_SITE_OTHER): Admitting: Family

## 2024-01-25 DIAGNOSIS — L253 Unspecified contact dermatitis due to other chemical products: Secondary | ICD-10-CM

## 2024-01-25 NOTE — Progress Notes (Cosign Needed Addendum)
 Follow-up Note  RE: Jean Myers FMW:[id number]   DOB: Aug 22, 1995 Date of Office Visit: 01/25/2024  Primary care provider: Georgina Speaks, FNP Referring provider: Georgina Speaks, FNP   [name] returns to the office today for the initial patch test interpretation, given suspected history of contact dermatitis.    Diagnostics:    NAC 80 48-hour reading:  NAC Panel Tested NAC-80 (1-80)   1. Ammonium persulfate Negative   2. [place] Balsam Negative   3. BENZISOTHIAZOLINONE Comment   omit  4. 4-tert-Butylphenolformaldehyde resin (PTBP) Negative   5. Bacitracin Negative   6. Budesonide Negative   7. Quaternium-15 Negative   8. Cinnamal Negative   9. Cobalt(II) chloride hexahydrate Negative  10. Colophonium Negative   11. Methyldibromo glutaronitrile Negative   12. Decyl Glucoside Negative   13. Ethylenediamine dihydrochloride Negative   14. 2-Hydroxyethyl methacrylate Negative   15. Hydroperoxides of Linalool Negative   16. Iodopropynyl butylcarbamate Negative   17. 2-Mercaptobenzothiazole (MBT) Negative   18. Thiuram mix Negative   19. METHYLISOTHIAZOLINONE Negative   20. Propylene glycol Negative   21. 1,3-Diphenylguanidine Negative   22. Hydroperoxides of Limonene Negative   23. Black rubber mix Negative   24. Carba mix Negative   25. Fragrance mix I Negative   26. Fragrance mix II Negative   27. Textile dye mix II Negative   28. Neomycin sulfate Negative   29. Nickel(II) sulfate hexahydrate Negative  30. p-Phenylenediamine (PPD) Negative   31. Potassium dichromate Negative   32. Propolis Negative   33. Sodium Metabisulfite Negative   34. Tixocortol-21-pivalate Negative   35. Lanolin alcohol Negative   36. Methylisothiazolinone + Methylchloroisothiazolinone Negative   37. Cocamidopropyl betaine Negative   38. 3-(Dimethylamino)-1-propylamine Negative   39. Formaldehyde Negative  40. Oleamidopropyl dimethylamine Negative   41. 2-Bromo-2-Nitropropane-l,3-diol Negative    42. Diazolidinyl urea Negative  43. DMDM Hydantoin Negative   44. Epoxy resin, Bisphenol A Negative   45. Benzophenone-4 +  46. Imidazolidinyl urea Negative   47. Lauryl polyglucose Negative  48 Methyl methacrylate Negative   49. Paraben mix Negative   50. Mercapto mix Negative   51. Caine mix III Negative   52. Mixed dialkyl thiourea +  53. Compositae mix II Negative   54. Toluenesulfonamide formaldehyde resin Negative   55. Tea Tree Oil oxidized Negative   56. Ylang-Ylang oil Negative   57. Amidoamine Negative   58. Amerchol L 101 Negative   59. Benzocaine  Negative   60. Benzyl alchohol Negative   61. Benzyl salicylate Negative   62. Chloroxylenol (PCMX) Negative   63. Cocamide DEA Negative   64. Clobetasol -17-propionate Negative   65. Toluene-2,5-Diamine sulfate Negative   66. Ethyl acrylate Negative   67. N-Isopropyl-N-phenyl--4-phenylenediamine (IPPD) Negative   68. Lidocaine  Negative   69. Hydroxyisohexyl 3-Cyclohexene Carboxaldehyde Comment   omit  70. Sesquiterpene lactone mix Negative   71. 2-n-Octyl-4-isothiazolin-3-one Negative   72. Propyl gallate Negative   73. Polymyxin B sulfate Negative   74. Pramoxine hydrochloride Negative   75. Sodium benzoate Negative   76. Sorbitan oleate Negative   77. Sorbitan sesquioleate Negative   78. Tocopherol Negative   79. BENZALKONIUM CHLORIDE Negative   80. Chlorhexidine digluconate Negative     Plan:   Allergic contact dermatitis - The patient has been provided detailed information regarding the substances she is sensitive to, as well as products containing the substances.   - Meticulous avoidance of these substances is recommended.  - If avoidance is not possible, the use of barrier creams  or lotions is recommended. - If symptoms persist or progress despite meticulous avoidance of substances, a dermatology referral may be warranted.  Verbally instructed that if she has severe burning again on right shoulder and  difficulty breathing to go to the Emergency Room  Thank you for the opportunity to care for this patient.  Please do not hesitate to contact me with questions.  Wanda Craze, FNP [facility]

## 2024-01-26 NOTE — Progress Notes (Unsigned)
 Follow Up Note  RE: Jean Myers MRN: [id number] DOB: May 09, 1995 Date of Office Visit: 01/27/2024  Referring provider: Georgina Speaks, FNP Primary care provider: Georgina Speaks, FNP  History of Present Illness: I had the pleasure of seeing Jean Myers for a [facility]  on 01/27/[facility]. She is a 28 y.o. female, who is being followed for dermatitis. Today she is here for final patch test interpretation, given suspected history of contact dermatitis. Dair interpreter present in person.   Diagnostics:  NAC-80 Panel 96 hour reading:   NAC-80 - 01/27/24 0935     NAC Reading Interval Day 5    NAC Panel Tested NAC-80 (1-80)    1. Ammonium persulfate Negative    2. [place] Balsam Negative    3. BENZISOTHIAZOLINONE --   OMIT   4. 4-tert-Butylphenolformaldehyde resin (PTBP) Negative    5. Bacitracin Negative    6. Budesonide Negative    7. Quaternium-15 Negative    8. Cinnamal Negative    9. Cobalt(II) chloride hexahydrate Negative    10. Colophonium Negative    11. Methyldibromo glutaronitrile Negative    12. Decyl Glucoside Negative    13. Ethylenediamine dihydrochloride Negative    14. 2-Hydroxyethyl methacrylate Negative    15. Hydroperoxides of Linalool Negative    16. Iodopropynyl butylcarbamate Negative    17. 2-Mercaptobenzothiazole (MBT) Negative    18. Thiuram mix Negative    19. METHYLISOTHIAZOLINONE Negative    20. Propylene glycol Negative    21. 1,3-Diphenylguanidine Negative    22. Hydroperoxides of Limonene Negative    23. Black rubber mix Negative    24. Carba mix Negative    25. Fragrance mix I Negative    26. Fragrance mix II Negative    27. Textile dye mix II Negative    28. Neomycin sulfate Negative    29. Nickel(II) sulfate hexahydrate Negative    30. p-Phenylenediamine (PPD) Negative    31. Potassium dichromate Negative    32. Propolis Negative    33. Sodium Metabisulfite Negative    34. Tixocortol-21-pivalate  Negative    35. Lanolin alcohol Negative    36. Methylisothiazolinone + Methylchloroisothiazolinone Negative    37. Cocamidopropyl betaine Negative    38. 3-(Dimethylamino)-1-propylamine Negative    39. Formaldehyde Negative    40. Oleamidopropyl dimethylamine Negative    41. 2-Bromo-2-Nitropropane-l,3-diol Negative    42. Diazolidinyl urea Negative    43. DMDM Hydantoin Negative    44. Epoxy resin, Bisphenol A Negative    45. Benzophenone-4 3+    46. Imidazolidinyl urea Negative    47. Lauryl polyglucose Negative    48 Methyl methacrylate Negative    49. Paraben mix Negative    50. Mercapto mix Negative    51. Caine mix III Negative    52. Mixed dialkyl thiourea +/-    53. Compositae mix II Negative    54. Toluenesulfonamide formaldehyde resin Negative    55. Tea Tree Oil oxidized Negative    56. Ylang-Ylang oil Negative    57. Amidoamine Negative    58. Amerchol L 101 Negative    59. Benzocaine  Negative    60. Benzyl alchohol Negative    61. Benzyl salicylate Negative    62. Chloroxylenol (PCMX) Negative    63. Cocamide DEA Negative    64. Clobetasol -17-propionate Negative    65. Toluene-2,5-Diamine sulfate Negative    66. Ethyl acrylate Negative    67. N-Isopropyl-N-phenyl--4-phenylenediamine (IPPD) Negative    68.  Lidocaine  Negative    69. Hydroxyisohexyl 3-Cyclohexene Carboxaldehyde --   OMIT   70. Sesquiterpene lactone mix Negative    71. 2-n-Octyl-4-isothiazolin-3-one Negative    72. Propyl gallate Negative    73. Polymyxin B sulfate Negative    74. Pramoxine hydrochloride Negative    75. Sodium benzoate Negative    76. Sorbitan oleate Negative    77. Sorbitan sesquioleate Negative    78. Tocopherol Negative    79. BENZALKONIUM CHLORIDE Negative    80. Chlorhexidine digluconate Negative           Assessment and Plan: Jean Myers is a 28 y.o. female with: Allergic contact dermatitis due to other agents NAC 80 patch testing was positive to:   Benzophenone-4 Mixed dialkyl thiourea  Avoid these items. Handout given. Safe list emailed.  Return in about 2 months (around 03/28/2024).  It was my pleasure to see Jean Myers today and participate in her care. Please feel free to contact me with any questions or concerns.  Sincerely,  Orlan Cramp, DO Allergy & Immunology  [facility]  [facility] office: [phone] [facility] office: [phone]

## 2024-01-27 ENCOUNTER — Ambulatory Visit (INDEPENDENT_AMBULATORY_CARE_PROVIDER_SITE_OTHER): Admitting: Allergy

## 2024-01-27 ENCOUNTER — Encounter: Payer: Self-pay | Admitting: Allergy

## 2024-01-27 DIAGNOSIS — L2389 Allergic contact dermatitis due to other agents: Secondary | ICD-10-CM

## 2024-01-27 NOTE — Therapy (Signed)
 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  obtaining treatment through [facility]--they come to her home every morning for observed treatment.   Past Surgical History:  Procedure Laterality Date   CESAREAN SECTION     Patient Active Problem List   Diagnosis Date Noted   Lumbar pain with radiation down both legs 11/28/2023   Class 1 obesity due to excess calories without serious comorbidity with body mass index (BMI) of 34.0 to 34.9 in adult 11/28/2023   Hand dermatitis 11/28/2023   Encounter for annual health examination 07/24/2023   Encounter for Papanicolaou smear of cervix 07/24/2023   Influenza vaccination declined 07/24/2023   COVID-19 vaccination declined 07/24/2023   Class 1 obesity due to excess calories without serious comorbidity with body mass index (BMI) of 33.0 to 33.9 in adult 07/24/2023   Hot flashes 07/24/2023   Menorrhagia with regular cycle 02/08/2023   Acute bilateral low back pain without sciatica 02/08/2023   Establishing care with new doctor, encounter for 02/08/2023   History of TB (tuberculosis) 02/08/2023   Post term pregnancy over 40 weeks 07/28/2020   Encounter for health examination of refugee 06/08/2020   Hx of cesarean section complicating pregnancy 06/08/2020   Language barrier affecting health care 06/08/2020   TB lung, 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  PERTINENT  HISTORY: TB,   WEIGHT BEARING RESTRICTIONS: No  FALLS:  Has patient fallen in last 6 months? No  LIVING ENVIRONMENT: Lives with: lives with their family Lives in: House/apartment Stairs: Yes: Internal: 12 steps; can reach both Has following equipment at home: None  OCCUPATION: not currently working   PRECAUTIONS: None ---------------------------------------------------------------------------------------------  SUBJECTIVE:                                                                                                                                                                                           SUBJECTIVE STATEMENT: Utilized video interpreter: Patient reports continued back pain.   Eval statement 01/03/2024: back pain onset for 3 years, has gotten worse over lat 6 months. Has no reports of n/t, however does  describe pain going down B legs alongside back pain. Pain is worse in the morning, occasionally not being able to get out of bed. Gets worse with walking, medication can help alleviate. Pain currently 5/10 No swelling, give reports of no n/t, however verbalizes radicular symptoms throughout entire BLE   RED FLAGS: None    PLOF: Independent  PATIENT GOALS: stop the pain  NEXT MD VISIT: will call o schedule ---------------------------------------------------------------------------------------------  OBJECTIVE:  Note: Objective measures were completed at Evaluation unless otherwise noted.  DIAGNOSTIC FINDINGS:  Waiting on results from 01/02/2024 MRI.   PATIENT SURVEYS:  MODI: 16/50 (32%)   COGNITION: Overall cognitive status: Within functional limits for tasks assessed   PALPATION: Tenderness to lumbar spine and   Lumbar contraction pattern  L Multifidus:good quality  R Multifidus:poor quality   SENSATION: Reports burning down BLE  MUSCLE LENGTH: Hamstrings: Right 15; deg; Left 15 deg  POSTURE: No Significant postural limitations and increased  lumbar lordosis   LUMBAR ROM:   AROM eval  Flexion 80%  Extension 100%  Right lateral flexion 70%  Left lateral flexion 70%  Right rotation 90%  Left rotation 90%   (Blank rows = not tested)  ! Indicates pain with testing  LOWER EXTREMITY ROM:     Active  Right eval Left eval  Hip flexion    Hip extension    Hip abduction    Hip adduction    Hip internal rotation    Hip external rotation    Knee flexion    Knee extension    Ankle dorsiflexion    Ankle plantarflexion    Ankle inversion    Ankle eversion     (Blank rows = not tested)  ! Indicates pain with testing  LOWER EXTREMITY MMT:    MMT Right eval Left eval  Hip flexion 4 4  Hip extension 4+ 4-  Hip abduction 4 4  Hip adduction    Hip internal rotation    Hip external rotation    Knee flexion    Knee extension    Ankle dorsiflexion    Ankle plantarflexion    Ankle inversion    Ankle eversion     (Blank rows = not tested)   ! Indicates pain with testing LUMBAR SPECIAL TESTS:  Prone instability test: Positive Slump: Positive  GAIT: Distance walked: 148ft Assistive device utilized: None Level of assistance: Complete Independence Comments: WFL  [facility] Adult PT Treatment:                                                   [facility] Adult PT Treatment:                                                DATE: 01/12/24 Therapeutic Exercise: Nustep level 5 x 8 mins while gathering subjective info and planning session with patient Seated hamstring stretch 2x30 BIL Supine SLR 2x10 BIL Supine QL stretch 2x30 BIL LTR 2x10 BIL Neuromuscular re-ed: Bridges 2x10 Supine 90/90 isometric hold 2x30 Supine sciatic nerve glide x20 BIL Prone press ups x10  [facility] Adult PT Treatment:  DATE: 01/03/2024 Self Care: Pt education POC discussion                                                                                                                               PATIENT  EDUCATION:  Education details: Pt received education regarding HEP performance, ADL performance, functional activity tolerance, impairment education, appropriate performance of therapeutic activities.  Person educated: Patient Education method: Explanation, Demonstration, Tactile cues, Verbal cues, and Handouts Education comprehension: verbalized understanding and returned demonstration  HOME EXERCISE PROGRAM: Access Code: [email or web address] URL: [email or web address] Date: 01/03/2024 Prepared by: Mabel Kiang  Exercises - Seated Hamstring Stretch  - 1 x daily - 7 x weekly - 2 sets - 1 reps - 57m hold - Supine Quadratus Lumborum Stretch  - 1 x daily - 7 x weekly - 2 sets - 1 reps - 74m hold - Supine Posterior Pelvic Tilt  - 1 x daily - 7 x weekly - 2-3 sets - 12 reps - 3s hold - Supine 90/90 Abdominal Bracing  - 1 x daily - 7 x weekly - 2-3 sets - 2 reps - 30s hold - Supine Bridge  - 1 x daily - 4 x weekly - 2-3 sets - 12 reps - 4s hold ---------------------------------------------------------------------------------------------  ASSESSMENT:  CLINICAL IMPRESSION: Patient presents to first follow up PT session reporting continued back pain, has been compliant with HEP. Session today focused on core and proximal hip strengthening as well as lumbar mobility. Patient was able to tolerate all prescribed exercises with no adverse effects. Patient continues to benefit from skilled PT services and should be progressed as able to improve functional independence.   Eval impression (01/03/2024): Pt. attended today's physical therapy session for evaluation of Low back pain. Pt has complaints of midline LBP with occasional B burning down BLE accompanied by worsening back pain. Pt has notable deficits and would benefit from therapeutic focus on lumbar stability, lumbar ROM,  lumbo pelvic strengthening, sciatic motility, and activity tolerance. Treatment performed today focused on pt education detailed  in the objective. Pt demonstrated good understanding of education provided. required minimal v/t cues and no assistance for appropriate performance with today's activities. Pt requires the intervention of skilled outpatient physical therapy to address the aforementioned deficits and progress towards a functional level in line with therapeutic goals.    OBJECTIVE IMPAIRMENTS: decreased ROM, decreased strength, improper body mechanics, and pain.   ACTIVITY LIMITATIONS: squatting, stairs, and locomotion level  PARTICIPATION LIMITATIONS: driving, shopping, and community activity  PERSONAL FACTORS: Fitness, Past/current experiences, Time since onset of injury/illness/exacerbation, and language barriers are also affecting patient's functional outcome.   REHAB POTENTIAL: Good  CLINICAL DECISION MAKING: Stable/uncomplicated  EVALUATION COMPLEXITY: Low   GOALS: Goals reviewed with patient? YES  SHORT TERM GOALS: Target date: 01/24/2024  Pt will be independent with administered HEP to demonstrate the competency necessary for long term managemnet of symptoms at home. Baseline: Goal status: INITIAL   LONG TERM GOALS:  Target date: 02/14/2024  Pt. Will achieve a MODI score of 10/50 (20%) as to demonstrate improvement in self-perceived functional ability with daily activities.  Baseline: 16/50 (32%) Goal status: INITIAL  2.  Pt will improve Global hip strength to a 4+/5 to demonstrate improvement in strength for quality of motion and activity performance. Baseline:  Goal status: INITIAL  3.  Pt will improve Lumbar AROM to 90% of standardized norms with less than 2/10 pain to demonstrate necessary mobility for high quality and safe ADLs  Baseline: see objective chart, 5/10 Goal status: INITIAL  4.   Pt will reports independently ambulating 1 hour  with no AD and less than 3/10 pain to demonstrate improved weightbearing tolerance, BLE strength, and functional capacity for community  ambulation.  Baseline:  Goal status: INITIAL  ---------------------------------------------------------------------------------------------  PLAN:  PT FREQUENCY: 1-2x/week  PT DURATION: 6 weeks  PLANNED INTERVENTIONS: 97110-Therapeutic exercises, 97530- Therapeutic activity, 97112- Neuromuscular re-education, 97535- Self Care, [id number]- Manual therapy, [phone]- Gait training, Patient/Family education, Balance training, Stair training, Taping, Joint mobilization, Spinal mobilization, Cryotherapy, and Moist heat.  PLAN FOR NEXT SESSION: Progress as indicated within current POC   Reyes CHRISTELLA Kohut PT  01/27/2024, 10:05 AM    For all possible CPT codes, reference the Planned Interventions line above.     Check all conditions that are expected to impact treatment: {Conditions expected to impact treatment:None of these apply   If treatment provided at initial evaluation, no treatment charged due to lack of authorization.

## 2024-01-27 NOTE — Patient Instructions (Addendum)
 Positive to Benzophenone-4 Mixed dialkyl thiourea  Avoid these items. Handout given. Safe list emailed.  Follow up with Dr. Marinda in 2 months or sooner if needed.

## 2024-01-30 NOTE — Therapy (Unsigned)
 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  obtaining treatment through [facility]--they come to her home every morning for observed treatment.   Past Surgical History:  Procedure Laterality Date   CESAREAN SECTION     Patient Active Problem List   Diagnosis Date Noted   Lumbar pain with radiation down both legs 11/28/2023   Class 1 obesity due to excess calories without serious comorbidity with body mass index (BMI) of 34.0 to 34.9 in adult 11/28/2023   Hand dermatitis 11/28/2023   Encounter for annual health examination 07/24/2023   Encounter for Papanicolaou smear of cervix 07/24/2023   Influenza vaccination declined 07/24/2023   COVID-19 vaccination declined 07/24/2023   Class 1 obesity due to excess calories without serious comorbidity with body mass index (BMI) of 33.0 to 33.9 in adult 07/24/2023   Hot flashes 07/24/2023   Menorrhagia with regular cycle 02/08/2023   Acute bilateral low back pain without sciatica 02/08/2023   Establishing care with new doctor, encounter for 02/08/2023   History of TB (tuberculosis) 02/08/2023   Post term pregnancy over 40 weeks 07/28/2020   Encounter for health examination of refugee 06/08/2020   Hx of cesarean section complicating pregnancy 06/08/2020   Language barrier affecting health care 06/08/2020   TB lung, 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 No  LIVING ENVIRONMENT: Lives with: lives with their family Lives in: House/apartment Stairs: Yes: Internal: 12 steps; can reach both Has following equipment at home: None  OCCUPATION: not currently working   PRECAUTIONS: None ---------------------------------------------------------------------------------------------  SUBJECTIVE:  SUBJECTIVE STATEMENT: Patient returns to PT with less pain reported. Unable to describe a pain pattern as pain reported with all positions and activities.  Eval statement 01/03/2024: back pain onset for 3 years, has gotten worse over lat 6 months. Has no reports of n/t, however does describe pain going down B legs alongside back pain. Pain is worse in the morning, occasionally not being able to get out of bed. Gets worse with walking, medication can help alleviate. Pain currently 5/10 No swelling, give reports of no n/t, however verbalizes radicular symptoms throughout entire BLE   RED FLAGS: None    PLOF: Independent  PATIENT GOALS: stop the pain  NEXT MD VISIT: will call o schedule ---------------------------------------------------------------------------------------------  OBJECTIVE:  Note: Objective measures were completed at Evaluation unless otherwise noted.  DIAGNOSTIC FINDINGS:  Waiting on results from  01/02/2024 MRI.   PATIENT SURVEYS:  MODI: 16/50 (32%)   COGNITION: Overall cognitive status: Within functional limits for tasks assessed   PALPATION: Tenderness to lumbar spine and   Lumbar contraction pattern  L Multifidus:good quality  R Multifidus:poor quality   SENSATION: Reports burning down BLE  MUSCLE LENGTH: Hamstrings: Right 15; deg; Left 15 deg  POSTURE: No Significant postural limitations and increased lumbar lordosis   LUMBAR ROM:   AROM eval  Flexion 80%  Extension 100%  Right lateral flexion 70%  Left lateral flexion 70%  Right rotation 90%  Left rotation 90%   (Blank rows = not tested)  ! Indicates pain with testing  LOWER EXTREMITY ROM:     Active  Right eval Left eval  Hip flexion    Hip extension    Hip abduction    Hip adduction    Hip internal rotation    Hip external rotation    Knee flexion    Knee extension    Ankle dorsiflexion    Ankle plantarflexion    Ankle inversion    Ankle eversion     (Blank rows = not tested)  ! Indicates pain with testing  LOWER EXTREMITY MMT:    MMT Right eval Left eval  Hip flexion 4 4  Hip extension 4+ 4-  Hip abduction 4 4  Hip adduction    Hip internal rotation    Hip external rotation    Knee flexion    Knee extension    Ankle dorsiflexion    Ankle plantarflexion    Ankle inversion    Ankle eversion     (Blank rows = not tested)   ! Indicates pain with testing LUMBAR SPECIAL TESTS:  Prone instability test: Positive Slump: Positive  GAIT: Distance walked: 161ft Assistive device utilized: None Level of assistance: Complete Independence Comments: WFL  [facility] Adult PT Treatment:                                                DATE: 01/31/24  Therapeutic Exercise: Nustep L4 8 min Neuromuscular re-ed: Supine hip fallouts RTB 15x B, 15/15 S/L clams RTB 15/15 Bridge against RTB 15x Therapeutic Activity: Seated hamstring stretch 30s x2 B Supine QL stretch 30s x2 B    [facility] Adult  PT Treatment:  DATE: 01/12/24 Therapeutic Exercise: Nustep level 5 x 8 mins while gathering subjective info and planning session with patient Seated hamstring stretch 2x30 BIL Supine SLR 2x10 BIL Supine QL stretch 2x30 BIL LTR 2x10 BIL Neuromuscular re-ed: Bridges 2x10 Supine 90/90 isometric hold 2x30 Supine sciatic nerve glide x20 BIL Prone press ups x10  [facility] Adult PT Treatment:                                                DATE: 01/03/2024 Self Care: Pt education POC discussion                                                                                                                               PATIENT EDUCATION:  Education details: Pt received education regarding HEP performance, ADL performance, functional activity tolerance, impairment education, appropriate performance of therapeutic activities.  Person educated: Patient Education method: Explanation, Demonstration, Tactile cues, Verbal cues, and Handouts Education comprehension: verbalized understanding and returned demonstration  HOME EXERCISE PROGRAM: Access Code: [email or web address] URL: [email or web address] Date: 01/03/2024 Prepared by: Mabel Kiang  Exercises - Seated Hamstring Stretch  - 1 x daily - 7 x weekly - 2 sets - 1 reps - 3m hold - Supine Quadratus Lumborum Stretch  - 1 x daily - 7 x weekly - 2 sets - 1 reps - 60m hold - Supine Posterior Pelvic Tilt  - 1 x daily - 7 x weekly - 2-3 sets - 12 reps - 3s hold - Supine 90/90 Abdominal Bracing  - 1 x daily - 7 x weekly - 2-3 sets - 2 reps - 30s hold - Supine Bridge  - 1 x daily - 4 x weekly - 2-3 sets - 12 reps - 4s hold ---------------------------------------------------------------------------------------------  ASSESSMENT:  CLINICAL IMPRESSION: Pain improving but continues to report symptoms with all positions and activities, unable to isolate or describe a distinct pain pattern.  Focus of session  was aerobic w/u followed by stretching and lumbosacral and core strengthening.   Eval impression (01/03/2024): Pt. attended today's physical therapy session for evaluation of Low back pain. Pt has complaints of midline LBP with occasional B burning down BLE accompanied by worsening back pain. Pt has notable deficits and would benefit from therapeutic focus on lumbar stability, lumbar ROM,  lumbo pelvic strengthening, sciatic motility, and activity tolerance. Treatment performed today focused on pt education detailed in the objective. Pt demonstrated good understanding of education provided. required minimal v/t cues and no assistance for appropriate performance with today's activities. Pt requires the intervention of skilled outpatient physical therapy to address the aforementioned deficits and progress towards a functional level in line with therapeutic goals.    OBJECTIVE IMPAIRMENTS: decreased ROM, decreased strength, improper body mechanics, and pain.   ACTIVITY LIMITATIONS: squatting, stairs, and locomotion level  PARTICIPATION LIMITATIONS: driving, shopping, and  community activity  PERSONAL FACTORS: Fitness, Past/current experiences, Time since onset of injury/illness/exacerbation, and language barriers are also affecting patient's functional outcome.   REHAB POTENTIAL: Good  CLINICAL DECISION MAKING: Stable/uncomplicated  EVALUATION COMPLEXITY: Low   GOALS: Goals reviewed with patient? YES  SHORT TERM GOALS: Target date: 01/24/2024  Pt will be independent with administered HEP to demonstrate the competency necessary for long term managemnet of symptoms at home. Baseline: Goal status: INITIAL   LONG TERM GOALS: Target date: 02/14/2024  Pt. Will achieve a MODI score of 10/50 (20%) as to demonstrate improvement in self-perceived functional ability with daily activities.  Baseline: 16/50 (32%) Goal status: INITIAL  2.  Pt will improve Global hip strength to a 4+/5 to demonstrate  improvement in strength for quality of motion and activity performance. Baseline:  Goal status: INITIAL  3.  Pt will improve Lumbar AROM to 90% of standardized norms with less than 2/10 pain to demonstrate necessary mobility for high quality and safe ADLs  Baseline: see objective chart, 5/10 Goal status: INITIAL  4.   Pt will reports independently ambulating 1 hour  with no AD and less than 3/10 pain to demonstrate improved weightbearing tolerance, BLE strength, and functional capacity for community ambulation.  Baseline:  Goal status: INITIAL  ---------------------------------------------------------------------------------------------  PLAN:  PT FREQUENCY: 1-2x/week  PT DURATION: 6 weeks  PLANNED INTERVENTIONS: 97110-Therapeutic exercises, 97530- Therapeutic activity, 97112- Neuromuscular re-education, 97535- Self Care, 02859- Manual therapy, [phone]- Gait training, Patient/Family education, Balance training, Stair training, Taping, Joint mobilization, Spinal mobilization, Cryotherapy, and Moist heat.  PLAN FOR NEXT SESSION: Progress as indicated within current POC   Reyes CHRISTELLA Kohut PT  01/31/2024, 2:42 PM    For all possible CPT codes, reference the Planned Interventions line above.     Check all conditions that are expected to impact treatment: {Conditions expected to impact treatment:None of these apply   If treatment provided at initial evaluation, no treatment charged due to lack of authorization.

## 2024-01-31 ENCOUNTER — Ambulatory Visit: Attending: Physical Medicine and Rehabilitation

## 2024-01-31 DIAGNOSIS — M6281 Muscle weakness (generalized): Secondary | ICD-10-CM | POA: Insufficient documentation

## 2024-01-31 DIAGNOSIS — M5459 Other low back pain: Secondary | ICD-10-CM | POA: Insufficient documentation

## 2024-02-15 ENCOUNTER — Ambulatory Visit

## 2024-02-16 ENCOUNTER — Ambulatory Visit

## 2024-02-16 DIAGNOSIS — M5459 Other low back pain: Secondary | ICD-10-CM

## 2024-02-16 DIAGNOSIS — M6281 Muscle weakness (generalized): Secondary | ICD-10-CM

## 2024-02-16 NOTE — Therapy (Signed)
 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  obtaining treatment through [facility]--they come to her home every morning for observed treatment.   Past Surgical History:  Procedure Laterality Date   CESAREAN SECTION     Patient Active Problem List   Diagnosis Date Noted   Lumbar pain with radiation down both legs 11/28/2023   Class 1 obesity due to excess calories without serious comorbidity with body mass index (BMI) of 34.0 to 34.9 in adult 11/28/2023   Hand dermatitis 11/28/2023   Encounter for annual health examination 07/24/2023   Encounter for Papanicolaou smear of cervix 07/24/2023   Influenza vaccination declined 07/24/2023   COVID-19 vaccination declined 07/24/2023   Class 1 obesity due to excess calories without serious comorbidity with body mass index (BMI) of 33.0 to 33.9 in adult 07/24/2023   Hot flashes 07/24/2023   Menorrhagia with regular cycle 02/08/2023   Acute bilateral low back pain without sciatica 02/08/2023   Establishing care with new doctor, encounter for 02/08/2023   History of TB (tuberculosis) 02/08/2023   Post term pregnancy over 40 weeks 07/28/2020   Encounter for health examination of refugee 06/08/2020   Hx of cesarean section complicating pregnancy 06/08/2020   Language barrier affecting health care 06/08/2020   TB lung, 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 No  LIVING ENVIRONMENT: Lives with: lives with their family Lives in: House/apartment Stairs: Yes: Internal: 12 steps; can reach both Has following equipment at home: None  OCCUPATION: not currently working   PRECAUTIONS: None ---------------------------------------------------------------------------------------------  SUBJECTIVE:  SUBJECTIVE STATEMENT: Patient reports that her pain has improved. However, d/t starting pain medicine about the same time as PT, she is not sure if PT was helpful. Her pain is 8/10 today, but this is impacted by her menstrual cycle.  Eval statement 01/03/2024: back pain onset for 3 years, has gotten worse over lat 6 months. Has no reports of n/t, however does describe pain going down B legs alongside back pain. Pain is worse in the morning, occasionally not being able to get out of bed. Gets worse with walking, medication can help alleviate.  Pain currently 5/10 No swelling, give reports of no n/t, however verbalizes radicular symptoms throughout entire BLE   RED FLAGS: None    PLOF: Independent  PATIENT GOALS: stop the pain  NEXT MD VISIT: will call o schedule ---------------------------------------------------------------------------------------------  OBJECTIVE:  Note: Objective measures were completed at Evaluation unless otherwise  noted.  DIAGNOSTIC FINDINGS:  Waiting on results from 01/02/2024 MRI.   PATIENT SURVEYS:  MODI: 16/50 (32%)   COGNITION: Overall cognitive status: Within functional limits for tasks assessed   PALPATION: Tenderness to lumbar spine and   Lumbar contraction pattern  L Multifidus:good quality  R Multifidus:poor quality   SENSATION: Reports burning down BLE  MUSCLE LENGTH: Hamstrings: Right 15; deg; Left 15 deg  POSTURE: No Significant postural limitations and increased lumbar lordosis   LUMBAR ROM:   AROM eval 02/16/24  Flexion 80% 90%  Extension 100% 100%, sharp pain  Right lateral flexion 70% 100%  Left lateral flexion 70% 100%  Right rotation 90% 100%  Left rotation 90% 100%   (Blank rows = not tested)  ! Indicates pain with testing  LOWER EXTREMITY ROM:     Active  Right eval Left eval  Hip flexion    Hip extension    Hip abduction    Hip adduction    Hip internal rotation    Hip external rotation    Knee flexion    Knee extension    Ankle dorsiflexion    Ankle plantarflexion    Ankle inversion    Ankle eversion     (Blank rows = not tested)  ! Indicates pain with testing  LOWER EXTREMITY MMT:    MMT Right eval Left eval Right 02/16/24 Left 02/16/24  Hip flexion 4 4 5 5   Hip extension 4+ 4- 4+ 4-  Hip abduction 4 4 4+ 4+  Hip adduction   4+ 4+  Hip internal rotation      Hip external rotation      Knee flexion      Knee extension      Ankle dorsiflexion      Ankle plantarflexion      Ankle inversion      Ankle eversion       (Blank rows = not tested)   ! Indicates pain with testing LUMBAR SPECIAL TESTS:  Prone instability test: Positive Slump: Positive  Double Limb Lowering Test for core/abdominal mm strength assessment   02/16/2024: 60   Rating 90 very poor, starting position 75 poor 60 below average 45 average 30 above average 15 good 0 excellent, legs horizontal   GAIT: Distance walked: 168ft Assistive device  utilized: None Level of assistance: Complete Independence Comments: WFL   TODAY'S TREATMENT:   [facility] Adult PT Treatment:  DATE: 02/16/2024  Therapeutic Exercise:  Supine 90/90 x 4 x 10-15sec  Therapeutic Activity:  Reassessment of objective measures and subjective assessment regarding progress towards established goals and updated plan for addressing remaining deficits and rehab goals.    [facility] Adult PT Treatment:                                                DATE: 01/31/24  Therapeutic Exercise: Nustep L4 8 min Neuromuscular re-ed: Supine hip fallouts RTB 15x B, 15/15 S/L clams RTB 15/15 Bridge against RTB 15x Therapeutic Activity: Seated hamstring stretch 30s x2 B Supine QL stretch 30s x2 B                                                                                                             PATIENT EDUCATION:  Education details: Pt received education regarding HEP performance, ADL performance, functional activity tolerance, impairment education, appropriate performance of therapeutic activities.  Person educated: Patient Education method: Explanation, Demonstration, Tactile cues, Verbal cues, and Handouts Education comprehension: verbalized understanding and returned demonstration  HOME EXERCISE PROGRAM: Access Code: [email or web address] URL: [email or web address] Date: 01/03/2024 Prepared by: Mabel Kiang  Exercises - Seated Hamstring Stretch  - 1 x daily - 7 x weekly - 2 sets - 1 reps - 64m hold - Supine Quadratus Lumborum Stretch  - 1 x daily - 7 x weekly - 2 sets - 1 reps - 63m hold - Supine Posterior Pelvic Tilt  - 1 x daily - 7 x weekly - 2-3 sets - 12 reps - 3s hold - Supine 90/90 Abdominal Bracing  - 1 x daily - 7 x weekly - 2-3 sets - 2 reps - 30s hold - Supine Bridge  - 1 x daily - 4 x weekly - 2-3 sets - 12 reps - 4s  hold ---------------------------------------------------------------------------------------------  ASSESSMENT:  CLINICAL IMPRESSION: 02/16/2024 Patient has attended 3 PT sessions to address persisted LBP with radicular symptoms to BIL LE. She is demonstrating good improvement of Lumbar AROM and LE MMT. However, she continues to present with moderate-to-severe pain while performing daily activities. Her prognosis is impacted by chronicity of symptoms. She requires ongoing skilled PT intervention in order to address remaining deficits and progress towards functional rehab goals. Plan is to continue 1x/week for 4 weeks progressing as tolerated.    Eval impression (01/03/2024): Pt. attended today's physical therapy session for evaluation of Low back pain. Pt has complaints of midline LBP with occasional B burning down BLE accompanied by worsening back pain. Pt has notable deficits and would benefit from therapeutic focus on lumbar stability, lumbar ROM,  lumbo pelvic strengthening, sciatic motility, and activity tolerance. Treatment performed today focused on pt education detailed in the objective. Pt demonstrated good understanding of education provided. required minimal v/t cues and no assistance for appropriate performance with today's activities. Pt requires the intervention of skilled outpatient physical therapy to address the aforementioned  deficits and progress towards a functional level in line with therapeutic goals.    OBJECTIVE IMPAIRMENTS: decreased ROM, decreased strength, improper body mechanics, and pain.   ACTIVITY LIMITATIONS: squatting, stairs, and locomotion level  PARTICIPATION LIMITATIONS: driving, shopping, and community activity  PERSONAL FACTORS: Fitness, Past/current experiences, Time since onset of injury/illness/exacerbation, and language barriers are also affecting patient's functional outcome.   REHAB POTENTIAL: Good  CLINICAL DECISION MAKING:  Stable/uncomplicated  EVALUATION COMPLEXITY: Low   GOALS: Goals reviewed with patient? YES  SHORT TERM GOALS: Target date: 01/24/2024  Pt will be independent with administered HEP to demonstrate the competency necessary for long term managemnet of symptoms at home. 02/16/24: performing at night, 3-4x/week  Goal status: MET   LONG TERM GOALS: Target date: 03/15/2024, last updated 02/16/2024  Pt. Will achieve a MODI score of 10/50 (20%) as to demonstrate improvement in self-perceived functional ability with daily activities.  Baseline: 16/50 (32%) 02/16/24: 14/50  Goal status: progressing   2.  Pt will improve Global hip strength to a 4+/5 to demonstrate improvement in strength for quality of motion and activity performance. see objective measures  Goal status:progressing  3.  Pt will improve Lumbar AROM to 90% of standardized norms with less than 2/10 pain to demonstrate necessary mobility for high quality and safe ADLs  Baseline: see objective chart, 5/10 02/16/24: ROM met, continues to have 6-8/10 pain  Goal status: progressing  4.   Pt will reports independently ambulating 1 hour  with no AD and less than 3/10 pain to demonstrate improved weightbearing tolerance, BLE strength, and functional capacity for community ambulation.  02/16/24: I can probably stand for at least 1 hour, but I have a lot of pain.  Goal status: progressing, with moderate-to-severe pain   ---------------------------------------------------------------------------------------------  PLAN:  PT FREQUENCY: 1x/week  PT DURATION: 4 weeks, last updated 02/16/2024  PLANNED INTERVENTIONS: 97110-Therapeutic exercises, 97530- Therapeutic activity, 97112- Neuromuscular re-education, 97535- Self Care, 02859- Manual therapy, [phone]- Gait training, Patient/Family education, Balance training, Stair training, Taping, Joint mobilization, Spinal mobilization, Cryotherapy, and Moist heat.  PLAN FOR NEXT SESSION:  continue with focus on core/lumbar stabilization and LQ mm endurance     Marko Molt, PT, DPT  02/16/2024 9:57 AM    For all possible CPT codes, reference the Planned Interventions line above.     Check all conditions that are expected to impact treatment: {Conditions expected to impact treatment:Social determinants of health   If treatment provided at initial evaluation, no treatment charged due to lack of authorization.

## 2024-02-22 ENCOUNTER — Ambulatory Visit

## 2024-02-22 DIAGNOSIS — M5459 Other low back pain: Secondary | ICD-10-CM

## 2024-02-22 DIAGNOSIS — M6281 Muscle weakness (generalized): Secondary | ICD-10-CM

## 2024-02-22 NOTE — Therapy (Signed)
 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  obtaining treatment through [facility]--they come to her home every morning for observed treatment.   Past Surgical History:  Procedure Laterality Date   CESAREAN SECTION     Patient Active Problem List   Diagnosis Date Noted   Lumbar pain with radiation down both legs 11/28/2023   Class 1 obesity due to excess calories without serious comorbidity with body mass index (BMI) of 34.0 to 34.9 in adult 11/28/2023   Hand dermatitis 11/28/2023   Encounter for annual health examination 07/24/2023   Encounter for Papanicolaou smear of cervix 07/24/2023   Influenza vaccination declined 07/24/2023   COVID-19 vaccination declined 07/24/2023   Class 1 obesity due to excess calories without serious comorbidity with body mass index (BMI) of 33.0 to 33.9 in adult 07/24/2023   Hot flashes 07/24/2023   Menorrhagia with regular cycle 02/08/2023   Acute bilateral low back pain without sciatica 02/08/2023   Establishing care with new doctor, encounter for 02/08/2023   History of TB (tuberculosis) 02/08/2023   Post term pregnancy over 40 weeks 07/28/2020   Encounter for health examination of refugee 06/08/2020   Hx of cesarean section complicating pregnancy 06/08/2020   Language barrier affecting health care 06/08/2020   TB lung, 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 No  LIVING ENVIRONMENT: Lives with: lives with their family Lives in: House/apartment Stairs: Yes: Internal: 12 steps; can reach both Has following equipment at home: None  OCCUPATION: not currently working   PRECAUTIONS: None ---------------------------------------------------------------------------------------------  SUBJECTIVE:  SUBJECTIVE STATEMENT:  Patient arrives reporting some improvement in her pain. She would like to complete her remaining visits, and then continue with exercises on her own. She requests to leave early today in order to make it home her children.    Eval statement 01/03/2024: back pain onset for 3 years, has gotten worse over lat 6 months. Has no reports of n/t, however does describe pain going down B legs alongside back pain. Pain is worse in the morning, occasionally not being able to get out of bed. Gets worse with walking, medication can help alleviate.  Pain currently 5/10 No swelling, give reports of no n/t, however verbalizes radicular symptoms throughout entire BLE   RED FLAGS: None    PLOF: Independent  PATIENT GOALS: stop the pain  NEXT MD VISIT: will call o  schedule ---------------------------------------------------------------------------------------------  OBJECTIVE:  Note: Objective measures were completed at Evaluation unless otherwise noted.  DIAGNOSTIC FINDINGS:  Waiting on results from 01/02/2024 MRI.   PATIENT SURVEYS:  MODI: 16/50 (32%)   COGNITION: Overall cognitive status: Within functional limits for tasks assessed   PALPATION: Tenderness to lumbar spine and   Lumbar contraction pattern  L Multifidus:good quality  R Multifidus:poor quality   SENSATION: Reports burning down BLE  MUSCLE LENGTH: Hamstrings: Right 15; deg; Left 15 deg  POSTURE: No Significant postural limitations and increased lumbar lordosis   LUMBAR ROM:   AROM eval 02/16/24  Flexion 80% 90%  Extension 100% 100%, sharp pain  Right lateral flexion 70% 100%  Left lateral flexion 70% 100%  Right rotation 90% 100%  Left rotation 90% 100%   (Blank rows = not tested)  ! Indicates pain with testing  LOWER EXTREMITY ROM:     Active  Right eval Left eval  Hip flexion    Hip extension    Hip abduction    Hip adduction    Hip internal rotation    Hip external rotation    Knee flexion    Knee extension    Ankle dorsiflexion    Ankle plantarflexion    Ankle inversion    Ankle eversion     (Blank rows = not tested)  ! Indicates pain with testing  LOWER EXTREMITY MMT:    MMT Right eval Left eval Right 02/16/24 Left 02/16/24  Hip flexion 4 4 5 5   Hip extension 4+ 4- 4+ 4-  Hip abduction 4 4 4+ 4+  Hip adduction   4+ 4+  Hip internal rotation      Hip external rotation      Knee flexion      Knee extension      Ankle dorsiflexion      Ankle plantarflexion      Ankle inversion      Ankle eversion       (Blank rows = not tested)   ! Indicates pain with testing LUMBAR SPECIAL TESTS:  Prone instability test: Positive Slump: Positive  Double Limb Lowering Test for core/abdominal mm strength assessment   02/16/2024:  60   Rating 90 very poor, starting position 75 poor 60 below average 45 average 30 above average 15 good 0 excellent, legs horizontal   GAIT: Distance walked: 133ft Assistive device utilized: None Level of assistance: Complete Independence Comments: WFL   TODAY'S TREATMENT:    [facility] Adult PT Treatment:  DATE: 02/22/2024   Therapeutic Exercise: Nustep L4 8 min Supine 90/90 x 4, 15sec Supine SLR 2 x 10   Neuromuscular re-ed: Supine hip fallouts green TB 2 x 10 each  Bridge against green TB 2 x 10  Isometric walkouts GTB x 5 each side                                                                        [facility] Adult PT Treatment:                                                DATE: 02/16/2024  Therapeutic Exercise:  Supine 90/90 x 4 x 10-15sec  Therapeutic Activity:  Reassessment of objective measures and subjective assessment regarding progress towards established goals and updated plan for addressing remaining deficits and rehab goals.    [facility] Adult PT Treatment:                                                DATE: 01/31/24  Therapeutic Exercise: Nustep L4 8 min Neuromuscular re-ed: Supine hip fallouts RTB 15x B, 15/15 S/L clams RTB 15/15 Bridge against RTB 15x Therapeutic Activity: Seated hamstring stretch 30s x2 B Supine QL stretch 30s x2 B                                                                                                             PATIENT EDUCATION:  Education details: Pt received education regarding HEP performance, ADL performance, functional activity tolerance, impairment education, appropriate performance of therapeutic activities.  Person educated: Patient Education method: Explanation, Demonstration, Tactile cues, Verbal cues, and Handouts Education comprehension: verbalized understanding and returned demonstration  HOME EXERCISE PROGRAM: Access Code: [email or web address] URL:  [email or web address] Date: 01/03/2024 Prepared by: Mabel Kiang  Exercises - Seated Hamstring Stretch  - 1 x daily - 7 x weekly - 2 sets - 1 reps - 43m hold - Supine Quadratus Lumborum Stretch  - 1 x daily - 7 x weekly - 2 sets - 1 reps - 36m hold - Supine Posterior Pelvic Tilt  - 1 x daily - 7 x weekly - 2-3 sets - 12 reps - 3s hold - Supine 90/90 Abdominal Bracing  - 1 x daily - 7 x weekly - 2-3 sets - 2 reps - 30s hold - Supine Bridge  - 1 x daily - 4 x weekly - 2-3 sets - 12 reps - 4s hold ---------------------------------------------------------------------------------------------  ASSESSMENT:  CLINICAL IMPRESSION: 02/22/2024 Jean Myers had good tolerance of today's treatment session,  which focused on gradual progression of core strengthening activities. Patient was able to begin WB stabilization exercises today. Session was somewhat abbreviated d/t request to leave early. Plan is to continue progression towards functional activities at remaining visits.      Eval impression (01/03/2024): Pt. attended today's physical therapy session for evaluation of Low back pain. Pt has complaints of midline LBP with occasional B burning down BLE accompanied by worsening back pain. Pt has notable deficits and would benefit from therapeutic focus on lumbar stability, lumbar ROM,  lumbo pelvic strengthening, sciatic motility, and activity tolerance. Treatment performed today focused on pt education detailed in the objective. Pt demonstrated good understanding of education provided. required minimal v/t cues and no assistance for appropriate performance with today's activities. Pt requires the intervention of skilled outpatient physical therapy to address the aforementioned deficits and progress towards a functional level in line with therapeutic goals.    OBJECTIVE IMPAIRMENTS: decreased ROM, decreased strength, improper body mechanics, and pain.   ACTIVITY LIMITATIONS: squatting, stairs,  and locomotion level  PARTICIPATION LIMITATIONS: driving, shopping, and community activity  PERSONAL FACTORS: Fitness, Past/current experiences, Time since onset of injury/illness/exacerbation, and language barriers are also affecting patient's functional outcome.   REHAB POTENTIAL: Good  CLINICAL DECISION MAKING: Stable/uncomplicated  EVALUATION COMPLEXITY: Low   GOALS: Goals reviewed with patient? YES  SHORT TERM GOALS: Target date: 01/24/2024  Pt will be independent with administered HEP to demonstrate the competency necessary for long term managemnet of symptoms at home. 02/16/24: performing at night, 3-4x/week  Goal status: MET   LONG TERM GOALS: Target date: 03/15/2024, last updated 02/16/2024  Pt. Will achieve a MODI score of 10/50 (20%) as to demonstrate improvement in self-perceived functional ability with daily activities.  Baseline: 16/50 (32%) 02/16/24: 14/50  Goal status: progressing   2.  Pt will improve Global hip strength to a 4+/5 to demonstrate improvement in strength for quality of motion and activity performance. see objective measures  Goal status:progressing  3.  Pt will improve Lumbar AROM to 90% of standardized norms with less than 2/10 pain to demonstrate necessary mobility for high quality and safe ADLs  Baseline: see objective chart, 5/10 02/16/24: ROM met, continues to have 6-8/10 pain  Goal status: progressing  4.   Pt will reports independently ambulating 1 hour  with no AD and less than 3/10 pain to demonstrate improved weightbearing tolerance, BLE strength, and functional capacity for community ambulation.  02/16/24: I can probably stand for at least 1 hour, but I have a lot of pain.  Goal status: progressing, with moderate-to-severe pain   ---------------------------------------------------------------------------------------------  PLAN:  PT FREQUENCY: 1x/week  PT DURATION: 4 weeks, last updated 02/16/2024  PLANNED INTERVENTIONS:  97110-Therapeutic exercises, 97530- Therapeutic activity, 97112- Neuromuscular re-education, 97535- Self Care, 02859- Manual therapy, [phone]- Gait training, Patient/Family education, Balance training, Stair training, Taping, Joint mobilization, Spinal mobilization, Cryotherapy, and Moist heat.  PLAN FOR NEXT SESSION: continue with focus on core/lumbar stabilization and LQ mm endurance     Marko Molt, PT, DPT  02/22/2024 3:25 PM    For all possible CPT codes, reference the Planned Interventions line above.     Check all conditions that are expected to impact treatment: {Conditions expected to impact treatment:Social determinants of health   If treatment provided at initial evaluation, no treatment charged due to lack of authorization.

## 2024-02-23 ENCOUNTER — Other Ambulatory Visit: Payer: Self-pay | Admitting: Family Medicine

## 2024-02-23 ENCOUNTER — Other Ambulatory Visit: Payer: Self-pay | Admitting: Physical Medicine and Rehabilitation

## 2024-02-23 DIAGNOSIS — M545 Low back pain, unspecified: Secondary | ICD-10-CM

## 2024-02-27 ENCOUNTER — Encounter: Payer: Self-pay | Admitting: Radiology

## 2024-02-29 ENCOUNTER — Ambulatory Visit: Attending: Physical Medicine and Rehabilitation

## 2024-02-29 DIAGNOSIS — M5459 Other low back pain: Secondary | ICD-10-CM | POA: Diagnosis present

## 2024-02-29 DIAGNOSIS — M6281 Muscle weakness (generalized): Secondary | ICD-10-CM | POA: Diagnosis present

## 2024-02-29 NOTE — Therapy (Signed)
 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  obtaining treatment through [facility]--they come to her home every morning for observed treatment.   Past Surgical History:  Procedure Laterality Date   CESAREAN SECTION     Patient Active Problem List   Diagnosis Date Noted   Lumbar pain with radiation down both legs 11/28/2023   Class 1 obesity due to excess calories without serious comorbidity with body mass index (BMI) of 34.0 to 34.9 in adult 11/28/2023   Hand dermatitis 11/28/2023   Encounter for annual health examination 07/24/2023   Encounter for Papanicolaou smear of cervix 07/24/2023   Influenza vaccination declined 07/24/2023   COVID-19 vaccination declined 07/24/2023   Class 1 obesity due to excess calories without serious comorbidity with body mass index (BMI) of 33.0 to 33.9 in adult 07/24/2023   Hot flashes 07/24/2023   Menorrhagia with regular cycle 02/08/2023   Acute bilateral low back pain without sciatica 02/08/2023   Establishing care with new doctor, encounter for 02/08/2023   History of TB (tuberculosis) 02/08/2023   Post term pregnancy over 40 weeks 07/28/2020   Encounter for health examination of refugee 06/08/2020   Hx of cesarean section complicating pregnancy 06/08/2020   Language barrier affecting health care 06/08/2020   TB lung, 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 No  LIVING ENVIRONMENT: Lives with: lives with their family Lives in: House/apartment Stairs: Yes: Internal: 12 steps; can reach both Has following equipment at home: None  OCCUPATION: not currently working   PRECAUTIONS: None ---------------------------------------------------------------------------------------------  SUBJECTIVE:  SUBJECTIVE STATEMENT:  Patient reporting that she continues to be consistent with HEP. In-person interpreter present throughout today's visit.   Eval statement 01/03/2024: back pain onset for 3 years, has gotten worse over lat 6 months. Has no reports of n/t, however does describe pain going down B legs alongside back pain. Pain is worse in the morning, occasionally not being able to get out of bed. Gets worse with walking, medication can help alleviate.  Pain currently 5/10 No swelling, give reports of no n/t, however verbalizes radicular symptoms throughout entire BLE   RED FLAGS: None    PLOF: Independent  PATIENT GOALS: stop the pain  NEXT MD VISIT: will call o schedule ---------------------------------------------------------------------------------------------  OBJECTIVE:  Note: Objective measures were completed at Evaluation unless otherwise noted.  DIAGNOSTIC  FINDINGS:  Waiting on results from 01/02/2024 MRI.   PATIENT SURVEYS:  MODI: 16/50 (32%)   COGNITION: Overall cognitive status: Within functional limits for tasks assessed   PALPATION: Tenderness to lumbar spine and   Lumbar contraction pattern  L Multifidus:good quality  R Multifidus:poor quality   SENSATION: Reports burning down BLE  MUSCLE LENGTH: Hamstrings: Right 15; deg; Left 15 deg  POSTURE: No Significant postural limitations and increased lumbar lordosis   LUMBAR ROM:   AROM eval 02/16/24  Flexion 80% 90%  Extension 100% 100%, sharp pain  Right lateral flexion 70% 100%  Left lateral flexion 70% 100%  Right rotation 90% 100%  Left rotation 90% 100%   (Blank rows = not tested)  ! Indicates pain with testing  LOWER EXTREMITY ROM:     Active  Right eval Left eval  Hip flexion    Hip extension    Hip abduction    Hip adduction    Hip internal rotation    Hip external rotation    Knee flexion    Knee extension    Ankle dorsiflexion    Ankle plantarflexion    Ankle inversion    Ankle eversion     (Blank rows = not tested)  ! Indicates pain with testing  LOWER EXTREMITY MMT:    MMT Right eval Left eval Right 02/16/24 Left 02/16/24  Hip flexion 4 4 5 5   Hip extension 4+ 4- 4+ 4-  Hip abduction 4 4 4+ 4+  Hip adduction   4+ 4+  Hip internal rotation      Hip external rotation      Knee flexion      Knee extension      Ankle dorsiflexion      Ankle plantarflexion      Ankle inversion      Ankle eversion       (Blank rows = not tested)   ! Indicates pain with testing LUMBAR SPECIAL TESTS:  Prone instability test: Positive Slump: Positive  Double Limb Lowering Test for core/abdominal mm strength assessment   02/16/2024: 60   Rating 90 very poor, starting position 75 poor 60 below average 45 average 30 above average 15 good 0 excellent, legs horizontal   GAIT: Distance walked: 179ft Assistive device utilized: None Level  of assistance: Complete Independence Comments: WFL   TODAY'S TREATMENT:    [facility] Adult PT Treatment:                                                DATE: 02/29/2024   Therapeutic Exercise:  Nustep L4 8 min Supine 90/90, 2 x 20 sec  Supine 90/90, x 30 sec Supine SLR 2 x 10   Neuromuscular re-ed: Supine hip fallouts green TB 2 x 10 each  Bridge against green TB 2 x 10  Supine Marching GTB 2 x 10  Cueing for TA bracing Hooklying dynadisc anterior-posterior pelvic tilts x 15 Cueing for TA bracing                                                                                                                                                                                  PATIENT EDUCATION:  Education details: Pt received education regarding HEP performance, ADL performance, functional activity tolerance, impairment education, appropriate performance of therapeutic activities.  Person educated: Patient Education method: Explanation, Demonstration, Tactile cues, Verbal cues, and Handouts Education comprehension: verbalized understanding and returned demonstration  HOME EXERCISE PROGRAM: Access Code: [email or web address] URL: [email or web address] Date: 02/29/2024 Prepared by: Marko Molt  Exercises - Supine Quadratus Lumborum Stretch  - 1 x daily - 7 x weekly - 2 sets - 1 reps - 34m hold - Supine Posterior Pelvic Tilt  - 1 x daily - 7 x weekly - 2 sets - 10 reps - 3s hold - Supine March with Resistance Band  - 1 x daily - 7 x weekly - 2 sets - 10 reps - 3s hold - Supine 90/90 Abdominal Bracing  - 1 x daily - 7 x weekly - 1 sets - 3 reps - 30s hold - Supine Bridge  - 1 x daily - 4 x weekly - 2 sets - 10 reps - 3s hold ---------------------------------------------------------------------------------------------  ASSESSMENT:  CLINICAL IMPRESSION: 02/29/2024 Charlotte had good tolerance of today's treatment session, which focused on progression of core stabilization activities.  She is demonstrating improved mm endurance. We will continue to progress per POC as tolerated, in order to reach established rehab goals.    Eval impression (01/03/2024): Pt. attended today's physical therapy session for evaluation of Low back pain. Pt has complaints of midline LBP with occasional B burning down BLE accompanied by worsening back pain. Pt has notable deficits and would benefit from therapeutic focus on lumbar stability, lumbar ROM,  lumbo pelvic strengthening, sciatic motility, and activity tolerance. Treatment performed today focused on pt education detailed in the objective. Pt demonstrated good understanding of education provided. required minimal v/t cues and no assistance for appropriate performance with today's activities. Pt requires the intervention of skilled outpatient physical therapy to address the aforementioned deficits and progress towards a functional level in line with therapeutic goals.    OBJECTIVE IMPAIRMENTS: decreased ROM, decreased strength, improper body mechanics, and pain.   ACTIVITY LIMITATIONS: squatting, stairs, and locomotion  level  PARTICIPATION LIMITATIONS: driving, shopping, and community activity  PERSONAL FACTORS: Fitness, Past/current experiences, Time since onset of injury/illness/exacerbation, and language barriers are also affecting patient's functional outcome.   REHAB POTENTIAL: Good  CLINICAL DECISION MAKING: Stable/uncomplicated  EVALUATION COMPLEXITY: Low   GOALS: Goals reviewed with patient? YES  SHORT TERM GOALS: Target date: 01/24/2024  Pt will be independent with administered HEP to demonstrate the competency necessary for long term managemnet of symptoms at home. 02/16/24: performing at night, 3-4x/week  Goal status: MET   LONG TERM GOALS: Target date: 03/15/2024, last updated 02/16/2024  Pt. Will achieve a MODI score of 10/50 (20%) as to demonstrate improvement in self-perceived functional ability with daily activities.   Baseline: 16/50 (32%) 02/16/24: 14/50  Goal status: progressing   2.  Pt will improve Global hip strength to a 4+/5 to demonstrate improvement in strength for quality of motion and activity performance. see objective measures  Goal status:progressing  3.  Pt will improve Lumbar AROM to 90% of standardized norms with less than 2/10 pain to demonstrate necessary mobility for high quality and safe ADLs  Baseline: see objective chart, 5/10 02/16/24: ROM met, continues to have 6-8/10 pain  Goal status: progressing  4.   Pt will reports independently ambulating 1 hour  with no AD and less than 3/10 pain to demonstrate improved weightbearing tolerance, BLE strength, and functional capacity for community ambulation.  02/16/24: I can probably stand for at least 1 hour, but I have a lot of pain.  Goal status: progressing, with moderate-to-severe pain   ---------------------------------------------------------------------------------------------  PLAN:  PT FREQUENCY: 1x/week  PT DURATION: 4 weeks, last updated 02/16/2024  PLANNED INTERVENTIONS: 97110-Therapeutic exercises, 97530- Therapeutic activity, 97112- Neuromuscular re-education, 97535- Self Care, 02859- Manual therapy, [phone]- Gait training, Patient/Family education, Balance training, Stair training, Taping, Joint mobilization, Spinal mobilization, Cryotherapy, and Moist heat.  PLAN FOR NEXT SESSION: continue with focus on core/lumbar stabilization and LQ mm endurance     Marko Molt, PT, DPT  02/29/2024 5:17 PM    For all possible CPT codes, reference the Planned Interventions line above.     Check all conditions that are expected to impact treatment: {Conditions expected to impact treatment:Social determinants of health   If treatment provided at initial evaluation, no treatment charged due to lack of authorization.

## 2024-03-06 ENCOUNTER — Ambulatory Visit

## 2024-03-06 NOTE — Therapy (Deleted)
 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  obtaining treatment through [facility]--they come to her home every morning for observed treatment.   Past Surgical History:  Procedure Laterality Date   CESAREAN SECTION     Patient Active Problem List   Diagnosis Date Noted   Lumbar pain with radiation down both legs 11/28/2023   Class 1 obesity due to excess calories without serious comorbidity with body mass index (BMI) of 34.0 to 34.9 in adult 11/28/2023   Hand dermatitis 11/28/2023   Encounter for annual health examination 07/24/2023   Encounter for Papanicolaou smear of cervix 07/24/2023   Influenza vaccination declined 07/24/2023   COVID-19 vaccination declined 07/24/2023   Class 1 obesity due to excess calories without serious comorbidity with body mass index (BMI) of 33.0 to 33.9 in adult 07/24/2023   Hot flashes 07/24/2023   Menorrhagia with regular cycle 02/08/2023   Acute bilateral low back pain without sciatica 02/08/2023   Establishing care with new doctor, encounter for 02/08/2023   History of TB (tuberculosis) 02/08/2023   Post term pregnancy over 40 weeks 07/28/2020   Encounter for health examination of refugee 06/08/2020   Hx of cesarean section complicating pregnancy 06/08/2020   Language barrier affecting health care 06/08/2020   TB lung, 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  PERTINENT HISTORY: TB,   WEIGHT BEARING RESTRICTIONS: No  FALLS:  Has patient fallen in last 6 months? No  LIVING ENVIRONMENT: Lives with: lives with their family Lives in: House/apartment Stairs: Yes: Internal: 12 steps; can reach both Has following equipment at home: None  OCCUPATION: not currently working   PRECAUTIONS: None ---------------------------------------------------------------------------------------------  SUBJECTIVE:                                                                                                                                                                                           SUBJECTIVE STATEMENT:   ***  Patient reporting that she continues to be consistent with HEP. In-person interpreter present throughout today's visit.   Eval statement 01/03/2024: back pain onset for  3 years, has gotten worse over lat 6 months. Has no reports of n/t, however does describe pain going down B legs alongside back pain. Pain is worse in the morning, occasionally not being able to get out of bed. Gets worse with walking, medication can help alleviate.  Pain currently 5/10 No swelling, give reports of no n/t, however verbalizes radicular symptoms throughout entire BLE   RED FLAGS: None    PLOF: Independent  PATIENT GOALS: stop the pain  NEXT MD VISIT: will call o schedule ---------------------------------------------------------------------------------------------  OBJECTIVE:  Note: Objective measures were completed at Evaluation unless otherwise noted.  DIAGNOSTIC FINDINGS:  Waiting on results from 01/02/2024 MRI.   PATIENT SURVEYS:  MODI: 16/50 (32%)   COGNITION: Overall cognitive status: Within functional limits for tasks assessed   PALPATION: Tenderness to lumbar spine and   Lumbar contraction pattern  L Multifidus:good quality  R Multifidus:poor quality   SENSATION: Reports burning down BLE  MUSCLE LENGTH: Hamstrings:  Right 15; deg; Left 15 deg  POSTURE: No Significant postural limitations and increased lumbar lordosis   LUMBAR ROM:   AROM eval 02/16/24  Flexion 80% 90%  Extension 100% 100%, sharp pain  Right lateral flexion 70% 100%  Left lateral flexion 70% 100%  Right rotation 90% 100%  Left rotation 90% 100%   (Blank rows = not tested)  ! Indicates pain with testing  LOWER EXTREMITY ROM:     Active  Right eval Left eval  Hip flexion    Hip extension    Hip abduction    Hip adduction    Hip internal rotation    Hip external rotation    Knee flexion    Knee extension    Ankle dorsiflexion    Ankle plantarflexion    Ankle inversion    Ankle eversion     (Blank rows = not tested)  ! Indicates pain with testing  LOWER EXTREMITY MMT:    MMT Right eval Left eval Right 02/16/24 Left 02/16/24  Hip flexion 4 4 5 5   Hip extension 4+ 4- 4+ 4-  Hip abduction 4 4 4+ 4+  Hip adduction   4+ 4+  Hip internal rotation      Hip external rotation      Knee flexion      Knee extension      Ankle dorsiflexion      Ankle plantarflexion      Ankle inversion      Ankle eversion       (Blank rows = not tested)   ! Indicates pain with testing LUMBAR SPECIAL TESTS:  Prone instability test: Positive Slump: Positive  Double Limb Lowering Test for core/abdominal mm strength assessment   02/16/2024: 60   Rating 90 very poor, starting position 75 poor 60 below average 45 average 30 above average 15 good 0 excellent, legs horizontal   GAIT: Distance walked: 186ft Assistive device utilized: None Level of assistance: Complete Independence Comments: WFL   TODAY'S TREATMENT:   [facility] Adult PT Treatment:                                                DATE: 03/06/24 Therapeutic Exercise: Nustep level 4 x 8 min Standing hip abduction/extension Supine SLR 2x10 BIL Bridges 2x10  Supine hip fallouts GTB BIL 2x10 Neuromuscular re-ed: PPT 2x10 Supine 90/90 2x30  [facility]  Adult PT Treatment:                                                DATE: 02/29/2024   Therapeutic Exercise: Nustep L4 8 min Supine 90/90, 2 x 20 sec  Supine 90/90, x 30 sec Supine SLR 2 x 10   Neuromuscular re-ed: Supine hip fallouts green TB 2 x 10 each  Bridge against green TB 2 x 10  Supine Marching GTB 2 x 10  Cueing for TA bracing Hooklying dynadisc anterior-posterior pelvic tilts x 15 Cueing for TA bracing                                                                                                                                                                                  PATIENT EDUCATION:  Education details: Pt received education regarding HEP performance, ADL performance, functional activity tolerance, impairment education, appropriate performance of therapeutic activities.  Person educated: Patient Education method: Explanation, Demonstration, Tactile cues, Verbal cues, and Handouts Education comprehension: verbalized understanding and returned demonstration  HOME EXERCISE PROGRAM: Access Code: [email or web address] URL: [email or web address] Date: 02/29/2024 Prepared by: Marko Molt  Exercises - Supine Quadratus Lumborum Stretch  - 1 x daily - 7 x weekly - 2 sets - 1 reps - 69m hold - Supine Posterior Pelvic Tilt  - 1 x daily - 7 x weekly - 2 sets - 10 reps - 3s hold - Supine March with Resistance Band  - 1 x daily - 7 x weekly - 2 sets - 10 reps - 3s hold - Supine 90/90 Abdominal Bracing  - 1 x daily - 7 x weekly - 1 sets - 3 reps - 30s hold - Supine Bridge  - 1 x daily - 4 x weekly - 2 sets - 10 reps - 3s hold ---------------------------------------------------------------------------------------------  ASSESSMENT:  CLINICAL IMPRESSION: ***  03/06/2024 Nera had good tolerance of today's treatment session, which focused on progression of core stabilization activities. She is demonstrating improved mm endurance. We will continue to progress per POC  as tolerated, in order to reach established rehab goals.    Eval impression (01/03/2024): Pt. attended today's physical therapy session for evaluation of Low back pain. Pt has complaints of midline LBP with occasional B burning down BLE accompanied by worsening back pain. Pt has notable deficits and would benefit from therapeutic focus on lumbar stability, lumbar ROM,  lumbo pelvic strengthening, sciatic motility, and activity tolerance. Treatment performed today focused on pt education detailed in the objective. Pt demonstrated good understanding of education provided. required minimal  v/t cues and no assistance for appropriate performance with today's activities. Pt requires the intervention of skilled outpatient physical therapy to address the aforementioned deficits and progress towards a functional level in line with therapeutic goals.    OBJECTIVE IMPAIRMENTS: decreased ROM, decreased strength, improper body mechanics, and pain.   ACTIVITY LIMITATIONS: squatting, stairs, and locomotion level  PARTICIPATION LIMITATIONS: driving, shopping, and community activity  PERSONAL FACTORS: Fitness, Past/current experiences, Time since onset of injury/illness/exacerbation, and language barriers are also affecting patient's functional outcome.   REHAB POTENTIAL: Good  CLINICAL DECISION MAKING: Stable/uncomplicated  EVALUATION COMPLEXITY: Low   GOALS: Goals reviewed with patient? YES  SHORT TERM GOALS: Target date: 01/24/2024  Pt will be independent with administered HEP to demonstrate the competency necessary for long term managemnet of symptoms at home. 02/16/24: performing at night, 3-4x/week  Goal status: MET   LONG TERM GOALS: Target date: 03/15/2024, last updated 02/16/2024  Pt. Will achieve a MODI score of 10/50 (20%) as to demonstrate improvement in self-perceived functional ability with daily activities.  Baseline: 16/50 (32%) 02/16/24: 14/50  Goal status: progressing   2.  Pt  will improve Global hip strength to a 4+/5 to demonstrate improvement in strength for quality of motion and activity performance. see objective measures  Goal status:progressing  3.  Pt will improve Lumbar AROM to 90% of standardized norms with less than 2/10 pain to demonstrate necessary mobility for high quality and safe ADLs  Baseline: see objective chart, 5/10 02/16/24: ROM met, continues to have 6-8/10 pain  Goal status: progressing  4.   Pt will reports independently ambulating 1 hour  with no AD and less than 3/10 pain to demonstrate improved weightbearing tolerance, BLE strength, and functional capacity for community ambulation.  02/16/24: I can probably stand for at least 1 hour, but I have a lot of pain.  Goal status: progressing, with moderate-to-severe pain   ---------------------------------------------------------------------------------------------  PLAN:  PT FREQUENCY: 1x/week  PT DURATION: 4 weeks, last updated 02/16/2024  PLANNED INTERVENTIONS: 97110-Therapeutic exercises, 97530- Therapeutic activity, 97112- Neuromuscular re-education, 97535- Self Care, 02859- Manual therapy, [phone]- Gait training, Patient/Family education, Balance training, Stair training, Taping, Joint mobilization, Spinal mobilization, Cryotherapy, and Moist heat.  PLAN FOR NEXT SESSION: continue with focus on core/lumbar stabilization and LQ mm endurance     Shanda Code, SPTA 03/06/2024 8:15 AM    For all possible CPT codes, reference the Planned Interventions line above.     Check all conditions that are expected to impact treatment: {Conditions expected to impact treatment:Social determinants of health   If treatment provided at initial evaluation, no treatment charged due to lack of authorization.

## 2024-03-13 ENCOUNTER — Ambulatory Visit

## 2024-03-13 DIAGNOSIS — M6281 Muscle weakness (generalized): Secondary | ICD-10-CM

## 2024-03-13 DIAGNOSIS — M5459 Other low back pain: Secondary | ICD-10-CM | POA: Diagnosis not present

## 2024-03-13 NOTE — Therapy (Signed)
 OUTPATIENT PHYSICAL THERAPY TREATMENT NOTE DISCHARGE NOTE   Patient Name: Jean Myers MRN: [id number] DOB:May 14, 1995, 28 y.o., female Today's Date: 03/13/2024  PHYSICAL THERAPY DISCHARGE SUMMARY  Visits from Start of Care: 7   Current functional level related to goals / functional outcomes: See objective findings/assessment    Remaining deficits: See objective findings/assessment    Education / Equipment: See today's treatment/assessment      Patient agrees to discharge. Patient goals were partially met. Patient is being discharged due to being pleased with the current functional level.     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  obtaining treatment through [facility]--they come to her home every morning for observed treatment.   Past Surgical History:  Procedure Laterality Date   CESAREAN SECTION     Patient Active Problem List   Diagnosis Date Noted   Lumbar pain with radiation down both legs 11/28/2023   Class 1 obesity due to excess calories without serious comorbidity with body mass index (BMI) of 34.0 to 34.9 in adult 11/28/2023   Hand dermatitis 11/28/2023   Encounter for annual health examination 07/24/2023   Encounter for Papanicolaou smear of cervix 07/24/2023   Influenza vaccination declined 07/24/2023   COVID-19 vaccination declined 07/24/2023   Class 1 obesity due to excess calories without serious comorbidity with body mass index (BMI) of 33.0 to 33.9 in adult 07/24/2023   Hot flashes 07/24/2023   Menorrhagia with  regular cycle 02/08/2023   Acute bilateral low back pain without sciatica 02/08/2023   Establishing care with new doctor, encounter for 02/08/2023   History of TB (tuberculosis) 02/08/2023   Post term pregnancy over 40 weeks 07/28/2020   Encounter for health examination of refugee 06/08/2020   Hx of cesarean section complicating pregnancy 06/08/2020   Language barrier affecting health care 06/08/2020   TB lung, 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 No  LIVING ENVIRONMENT: Lives with: lives with their family Lives in: House/apartment Stairs: Yes: Internal: 12 steps; can reach both Has following equipment at home: None  OCCUPATION: not currently working   PRECAUTIONS: None ---------------------------------------------------------------------------------------------  SUBJECTIVE:  SUBJECTIVE STATEMENT:  Patient reporting that she continues to be consistent with HEP. She would like to continue with exercises independently following today's session. She plans to f/u with physician should her symptoms persist or worsen. In-person interpreter present throughout today's visit.   Eval statement 01/03/2024: back pain onset for 3 years, has gotten worse over lat 6 months. Has no reports of n/t, however does describe  pain going down B legs alongside back pain. Pain is worse in the morning, occasionally not being able to get out of bed. Gets worse with walking, medication can help alleviate.  Pain currently 5/10 No swelling, give reports of no n/t, however verbalizes radicular symptoms throughout entire BLE   RED FLAGS: None    PLOF: Independent  PATIENT GOALS: stop the pain  NEXT MD VISIT: will call o schedule ---------------------------------------------------------------------------------------------  OBJECTIVE:  Note: Objective measures were completed at Evaluation unless otherwise noted.  DIAGNOSTIC FINDINGS:  Waiting on results from 01/02/2024 MRI.   PATIENT SURVEYS:  MODI: 16/50 (32%)   COGNITION: Overall cognitive status: Within functional limits for tasks assessed   PALPATION: Tenderness to lumbar spine and   Lumbar contraction pattern  L Multifidus:good quality  R Multifidus:poor quality   SENSATION: Reports burning down BLE  MUSCLE LENGTH: Hamstrings: Right 15; deg; Left 15 deg  POSTURE: No Significant postural limitations and increased lumbar lordosis   LUMBAR ROM:   AROM eval 02/16/24 03/13/24  Flexion 80% 90% 95% just above toes  Extension 100% 100%, sharp pain   Right lateral flexion 70% 100%   Left lateral flexion 70% 100%   Right rotation 90% 100%   Left rotation 90% 100%    (Blank rows = not tested)  ! Indicates pain with testing  LOWER EXTREMITY ROM:     Active  Right eval Left eval  Hip flexion    Hip extension    Hip abduction    Hip adduction    Hip internal rotation    Hip external rotation    Knee flexion    Knee extension    Ankle dorsiflexion    Ankle plantarflexion    Ankle inversion    Ankle eversion     (Blank rows = not tested)  ! Indicates pain with testing  LOWER EXTREMITY MMT:    MMT Right eval Left eval Right 02/16/24 Left 02/16/24 Right 03/13/24 Left 03/13/24  Hip flexion 4 4 5 5 5 5   Hip extension 4+ 4- 4+ 4- 5  5  Hip abduction 4 4 4+ 4+ 5 5  Hip adduction   4+ 4+ 5 5  Hip internal rotation        Hip external rotation        Knee flexion        Knee extension        Ankle dorsiflexion        Ankle plantarflexion        Ankle inversion        Ankle eversion         (Blank rows = not tested)   ! Indicates pain with testing LUMBAR SPECIAL TESTS:  Prone instability test: Positive Slump: Positive  Double Limb Lowering Test for core/abdominal mm strength assessment   02/16/2024: 60 03/13/2024: 30   Rating 90 very poor, starting position 75 poor 60 below average 45 average 30 above average 15 good 0 excellent, legs horizontal   GAIT: Distance walked: 178ft Assistive device utilized: None Level of assistance: Complete Independence Comments: [facility]  TODAY'S TREATMENT:    [facility] Adult PT Treatment:                                                DATE: 03/13/2024    Therapeutic Activity:  Reassessment of objective measures and subjective assessment regarding progress towards established goals and updated plan for addressing remaining deficits and rehab goals.  Updated HEP and reviewed plan for ongoing progression                                                                                                                                                                                    PATIENT EDUCATION:  Education details: Pt received education regarding HEP performance, ADL performance, functional activity tolerance, impairment education, appropriate performance of therapeutic activities.  Person educated: Patient Education method: Explanation, Demonstration, Tactile cues, Verbal cues, and Handouts Education comprehension: verbalized understanding and returned demonstration  HOME EXERCISE PROGRAM: Access Code: [email or web address] URL: [email or web address] Date: 02/29/2024 Prepared by: Marko Molt  Exercises - Supine Quadratus Lumborum Stretch  - 1 x  daily - 7 x weekly - 2 sets - 1 reps - 23m hold - Supine Posterior Pelvic Tilt  - 1 x daily - 7 x weekly - 2 sets - 10 reps - 3s hold - Supine March with Resistance Band  - 1 x daily - 7 x weekly - 2 sets - 10 reps - 3s hold - Supine 90/90 Abdominal Bracing  - 1 x daily - 7 x weekly - 1 sets - 3 reps - 30s hold - Supine Bridge  - 1 x daily - 4 x weekly - 2 sets - 10 reps - 3s hold ---------------------------------------------------------------------------------------------  ASSESSMENT:  CLINICAL IMPRESSION: 03/13/2024  Patient has attended 7 PT sessions to address low back ain with radicular symptoms.  She is demonstrating improved abdominal and LQ strength. She has improved activity tolerance, however she continues to report pain with prolonged standing and ambulation. She should continue with updated HEP at this time for ongoing improvement. She requires ongoing skilled PT intervention in order to address remaining deficits and progress towards functional rehab goals.    Eval impression (01/03/2024): Pt. attended today's physical therapy session for evaluation of Low back pain. Pt has complaints of midline LBP with occasional B burning down BLE accompanied by worsening back pain. Pt has notable deficits and would benefit from therapeutic focus on lumbar stability, lumbar ROM,  lumbo pelvic strengthening, sciatic motility, and activity tolerance. Treatment performed today focused on pt education detailed in the objective. Pt demonstrated good  understanding of education provided. required minimal v/t cues and no assistance for appropriate performance with today's activities. Pt requires the intervention of skilled outpatient physical therapy to address the aforementioned deficits and progress towards a functional level in line with therapeutic goals.    OBJECTIVE IMPAIRMENTS: decreased ROM, decreased strength, improper body mechanics, and pain.   ACTIVITY LIMITATIONS: squatting, stairs, and  locomotion level  PARTICIPATION LIMITATIONS: driving, shopping, and community activity  PERSONAL FACTORS: Fitness, Past/current experiences, Time since onset of injury/illness/exacerbation, and language barriers are also affecting patient's functional outcome.   REHAB POTENTIAL: Good  CLINICAL DECISION MAKING: Stable/uncomplicated  EVALUATION COMPLEXITY: Low   GOALS: Goals reviewed with patient? YES  SHORT TERM GOALS: Target date: 01/24/2024  Pt will be independent with administered HEP to demonstrate the competency necessary for long term managemnet of symptoms at home. 02/16/24: performing at night, 3-4x/week  Goal status: MET   LONG TERM GOALS: Target date: 03/15/2024, last updated 02/16/2024  Pt. Will achieve a MODI score of 10/50 (20%) as to demonstrate improvement in self-perceived functional ability with daily activities.  Baseline: 16/50 (32%) 02/16/24: 14/50  03/13/24: 18/50  Goal status: not met    2.  Pt will improve Global hip strength to a 4+/5 to demonstrate improvement in strength for quality of motion and activity performance. see objective measures  Goal status:MET  3.  Pt will improve Lumbar AROM to 90% of standardized norms with less than 2/10 pain to demonstrate necessary mobility for high quality and safe ADLs  Baseline: see objective chart, 5/10 02/16/24: ROM met, continues to have 6-8/10 pain  Goal status: MET  4.   Pt will reports independently ambulating 1 hour  with no AD and less than 3/10 pain to demonstrate improved weightbearing tolerance, BLE strength, and functional capacity for community ambulation.  02/16/24: I can probably stand for at least 1 hour, but I have a lot of pain.  03/13/24: continues to have pain 8-10/10 with 1 hour of walking  Goal status: not met    ---------------------------------------------------------------------------------------------  PLAN:  PT FREQUENCY: 1x/week  PT DURATION: 4 weeks, last updated  02/16/2024  PLANNED INTERVENTIONS: 97110-Therapeutic exercises, 97530- Therapeutic activity, 97112- Neuromuscular re-education, 97535- Self Care, 02859- Manual therapy, [phone]- Gait training, Patient/Family education, Balance training, Stair training, Taping, Joint mobilization, Spinal mobilization, Cryotherapy, and Moist heat.    Marko Molt, PT, DPT  03/13/2024 4:06 PM

## 2024-03-20 ENCOUNTER — Ambulatory Visit

## 2024-03-26 ENCOUNTER — Ambulatory Visit: Admitting: Internal Medicine

## 2024-04-16 ENCOUNTER — Other Ambulatory Visit: Payer: Self-pay

## 2024-04-16 ENCOUNTER — Ambulatory Visit: Admitting: Internal Medicine

## 2024-04-16 ENCOUNTER — Encounter: Payer: Self-pay | Admitting: Internal Medicine

## 2024-04-16 VITALS — BP 106/68 | HR 86 | Temp 97.8°F

## 2024-04-16 DIAGNOSIS — L309 Dermatitis, unspecified: Secondary | ICD-10-CM

## 2024-04-16 DIAGNOSIS — Z603 Acculturation difficulty: Secondary | ICD-10-CM | POA: Diagnosis not present

## 2024-04-16 DIAGNOSIS — L2389 Allergic contact dermatitis due to other agents: Secondary | ICD-10-CM | POA: Insufficient documentation

## 2024-04-16 DIAGNOSIS — Z758 Other problems related to medical facilities and other health care: Secondary | ICD-10-CM | POA: Diagnosis not present

## 2024-04-16 NOTE — Progress Notes (Signed)
 "  FOLLOW UP Date of Service/Encounter:   04/16/2024  Subjective:  Jean Myers (DOB: 1996-04-18) is a 28 y.o. female who [facility] in re-evaluation of the following: hand dermatitis History obtained from: chart review and patient and in person dari interpreter.  For Review, LV was on 01/27/24  with Dr. Luke seen for final patch test read. See below for summary of history and diagnostics.   Therapeutic plans/changes recommended:  Benzophenone-4 Mixed dialkyl thiourea ----------------------------------------------------- Pertinent History/Diagnostics:  Hand dermatitis with Contact dermatitis:  Intermittent pruritus, erythema, and papules on the hands for the past four to five months. Symptoms improve with glove use during dishwashing.  Symptoms are localized to the dorsal aspect of the hand. Potential irritants include harsh soaps or allergens in personal products.  Patch testing positive to Benzophenone-4 Mixed dialkyl thiourea --------------------------------------------------- Today presents for follow-up. Discussed the use of AI scribe software for clinical note transcription with the patient, who gave verbal consent to proceed.  History of Present Illness Jean Myers is a 28 year old female who presents with hand dermatitis. She is accompanied by her daughters.  Hand dermatitis - Currently asymptomatic - Intermittent pruritus of the hands, occurring only once since the last visit - Topical cream used as needed for pruritus, with effective symptom relief - No frequent need for topical therapy; refill remains available at pharmacy  Environmental exposures - Discontinued use of powder detergent around the same time as starting topical cream - Uncertain whether symptom improvement is due to cessation of detergent or initiation of topical therapy - would like another copy of the ingredients to be avoided  All medications reviewed by  clinical staff and updated in chart. No new pertinent medical or surgical history except as noted in HPI.  ROS: All others negative except as noted per HPI.   Objective:  BP 106/68 (BP Location: Right Arm, Patient Position: Sitting, Cuff Size: Normal)   Pulse 86   Temp 97.8 F (36.6 C) (Temporal)   SpO2 97%  There is no height or weight on file to calculate BMI. Physical Exam: General Appearance:  Alert, cooperative, no distress, appears stated age  Head:  Normocephalic, without obvious abnormality, atraumatic  Eyes:  Conjunctiva clear, EOM's intact  Nose: Nares normal, no rhinorrhea  Throat: Lips, tongue normal; teeth and gums normal, moist mucus membranes  Neck: Supple, symmetrical  Lungs:   clear to auscultation bilaterally, Respirations unlabored, no coughing  Heart:  , Appears well perfused  Extremities: No edema  Skin: Skin color, texture, turgor normal and no rashes or lesions on visualized portions of skin  Neurologic: No gross deficits   Labs:  Lab Orders  No laboratory test(s) ordered today    Assessment/Plan   Hand dermatitis with Contact dermatitis:  Intermittent pruritus, erythema, and papules on the hands for the past four to five months. Symptoms improve with glove use during dishwashing.  Symptoms are localized to the dorsal aspect of the hand. Potential irritants include harsh soaps or allergens in personal products.  Patch testing positive to Benzophenone-4 Mixed dialkyl thiourea  Avoid:  Benzophenone-4 - UV filter used in many personal care products Mixed dialkyl thiourea - used in probation officer   - continue clobetasol  topical steroid for use during flare-ups, twice daily.  Follow up : 6 months, sooner if needed It was a pleasure seeing you again in clinic today! Thank you for allowing me to participate in your care.  Other: none  Rocky  Marinda, MD  [facility]        "

## 2024-04-16 NOTE — Patient Instructions (Addendum)
 Hand dermatitis with Contact dermatitis:  Intermittent pruritus, erythema, and papules on the hands for the past four to five months. Symptoms improve with glove use during dishwashing.  Symptoms are localized to the dorsal aspect of the hand. Potential irritants include harsh soaps or allergens in personal products.  Patch testing positive to Benzophenone-4 Mixed dialkyl thiourea  Avoid:  Benzophenone-4 - UV filter used in many personal care products Mixed dialkyl thiourea - used in probation officer   - continue clobetasol  topical steroid for use during flare-ups, twice daily.  Follow up : 6 months, sooner if needed It was a pleasure seeing you again in clinic today! Thank you for allowing me to participate in your care.  Rocky Endow, MD [facility]

## 2024-07-18 ENCOUNTER — Encounter: Payer: Self-pay | Admitting: Nurse Practitioner

## 2024-10-22 ENCOUNTER — Ambulatory Visit: Admitting: Internal Medicine
